# Patient Record
Sex: Female | Born: 1965 | Race: Black or African American | Hispanic: No | Marital: Single | Smoking: Never smoker
Health system: Southern US, Community
[De-identification: ages and names within clinical notes are randomized; demographics above are authoritative.]

## PROBLEM LIST (undated history)

## (undated) DIAGNOSIS — R51 Headache: Secondary | ICD-10-CM

## (undated) DIAGNOSIS — K219 Gastro-esophageal reflux disease without esophagitis: Secondary | ICD-10-CM

## (undated) DIAGNOSIS — R42 Dizziness and giddiness: Secondary | ICD-10-CM

## (undated) DIAGNOSIS — R011 Cardiac murmur, unspecified: Secondary | ICD-10-CM

## (undated) DIAGNOSIS — E236 Other disorders of pituitary gland: Secondary | ICD-10-CM

## (undated) DIAGNOSIS — E7800 Pure hypercholesterolemia, unspecified: Secondary | ICD-10-CM

## (undated) DIAGNOSIS — I10 Essential (primary) hypertension: Secondary | ICD-10-CM

## (undated) HISTORY — DX: Dizziness and giddiness: R42

## (undated) HISTORY — PX: TONSILLECTOMY: SUR1361

## (undated) HISTORY — DX: Gastro-esophageal reflux disease without esophagitis: K21.9

## (undated) HISTORY — DX: Pure hypercholesterolemia, unspecified: E78.00

## (undated) HISTORY — DX: Other disorders of pituitary gland: E23.6

---

## 1999-08-13 ENCOUNTER — Other Ambulatory Visit: Admission: RE | Admit: 1999-08-13 | Discharge: 1999-08-13 | Payer: Self-pay | Admitting: Obstetrics & Gynecology

## 1999-08-31 ENCOUNTER — Ambulatory Visit (HOSPITAL_COMMUNITY): Admission: RE | Admit: 1999-08-31 | Discharge: 1999-08-31 | Payer: Self-pay | Admitting: Obstetrics & Gynecology

## 2000-07-18 ENCOUNTER — Ambulatory Visit (HOSPITAL_COMMUNITY): Admission: RE | Admit: 2000-07-18 | Discharge: 2000-07-18 | Payer: Self-pay | Admitting: *Deleted

## 2000-07-18 ENCOUNTER — Encounter (INDEPENDENT_AMBULATORY_CARE_PROVIDER_SITE_OTHER): Payer: Self-pay

## 2000-08-28 ENCOUNTER — Other Ambulatory Visit: Admission: RE | Admit: 2000-08-28 | Discharge: 2000-08-28 | Payer: Self-pay | Admitting: *Deleted

## 2001-09-04 ENCOUNTER — Other Ambulatory Visit: Admission: RE | Admit: 2001-09-04 | Discharge: 2001-09-04 | Payer: Self-pay | Admitting: *Deleted

## 2002-09-07 ENCOUNTER — Other Ambulatory Visit: Admission: RE | Admit: 2002-09-07 | Discharge: 2002-09-07 | Payer: Self-pay | Admitting: Obstetrics & Gynecology

## 2003-04-06 ENCOUNTER — Encounter: Admission: RE | Admit: 2003-04-06 | Discharge: 2003-04-06 | Payer: Self-pay | Admitting: Obstetrics and Gynecology

## 2003-04-06 ENCOUNTER — Encounter: Payer: Self-pay | Admitting: Obstetrics and Gynecology

## 2003-06-07 ENCOUNTER — Encounter: Payer: Self-pay | Admitting: Family Medicine

## 2003-06-07 ENCOUNTER — Encounter: Admission: RE | Admit: 2003-06-07 | Discharge: 2003-06-07 | Payer: Self-pay | Admitting: Family Medicine

## 2003-09-19 ENCOUNTER — Other Ambulatory Visit: Admission: RE | Admit: 2003-09-19 | Discharge: 2003-09-19 | Payer: Self-pay | Admitting: Obstetrics & Gynecology

## 2004-06-08 ENCOUNTER — Encounter: Admission: RE | Admit: 2004-06-08 | Discharge: 2004-06-08 | Payer: Self-pay | Admitting: Obstetrics & Gynecology

## 2004-09-20 ENCOUNTER — Ambulatory Visit: Payer: Self-pay | Admitting: Cardiology

## 2004-09-27 ENCOUNTER — Ambulatory Visit: Payer: Self-pay

## 2005-09-26 ENCOUNTER — Encounter: Payer: Self-pay | Admitting: Obstetrics & Gynecology

## 2006-09-29 ENCOUNTER — Encounter: Admission: RE | Admit: 2006-09-29 | Discharge: 2006-09-29 | Payer: Self-pay | Admitting: Obstetrics & Gynecology

## 2006-10-03 ENCOUNTER — Encounter: Admission: RE | Admit: 2006-10-03 | Discharge: 2006-10-03 | Payer: Self-pay | Admitting: Obstetrics & Gynecology

## 2006-10-07 ENCOUNTER — Encounter: Admission: RE | Admit: 2006-10-07 | Discharge: 2006-10-07 | Payer: Self-pay | Admitting: Obstetrics & Gynecology

## 2006-10-07 ENCOUNTER — Encounter (INDEPENDENT_AMBULATORY_CARE_PROVIDER_SITE_OTHER): Payer: Self-pay | Admitting: Specialist

## 2006-10-07 HISTORY — PX: BREAST BIOPSY: SHX20

## 2007-10-01 ENCOUNTER — Encounter: Admission: RE | Admit: 2007-10-01 | Discharge: 2007-10-01 | Payer: Self-pay | Admitting: Obstetrics & Gynecology

## 2008-10-03 ENCOUNTER — Encounter: Admission: RE | Admit: 2008-10-03 | Discharge: 2008-10-03 | Payer: Self-pay | Admitting: Obstetrics & Gynecology

## 2009-03-05 ENCOUNTER — Emergency Department (HOSPITAL_COMMUNITY): Admission: EM | Admit: 2009-03-05 | Discharge: 2009-03-05 | Payer: Self-pay | Admitting: Emergency Medicine

## 2009-04-11 ENCOUNTER — Ambulatory Visit: Payer: Self-pay | Admitting: Licensed Clinical Social Worker

## 2009-04-17 ENCOUNTER — Ambulatory Visit: Payer: Self-pay | Admitting: Licensed Clinical Social Worker

## 2009-05-02 ENCOUNTER — Ambulatory Visit: Payer: Self-pay | Admitting: Licensed Clinical Social Worker

## 2009-05-16 ENCOUNTER — Ambulatory Visit: Payer: Self-pay | Admitting: Licensed Clinical Social Worker

## 2009-10-04 ENCOUNTER — Encounter: Admission: RE | Admit: 2009-10-04 | Discharge: 2009-10-04 | Payer: Self-pay | Admitting: Obstetrics & Gynecology

## 2010-01-25 ENCOUNTER — Other Ambulatory Visit: Admission: RE | Admit: 2010-01-25 | Discharge: 2010-01-25 | Payer: Self-pay | Admitting: Obstetrics and Gynecology

## 2010-09-30 ENCOUNTER — Encounter: Payer: Self-pay | Admitting: Obstetrics & Gynecology

## 2010-10-05 ENCOUNTER — Other Ambulatory Visit: Payer: Self-pay | Admitting: Obstetrics and Gynecology

## 2010-10-05 DIAGNOSIS — Z1239 Encounter for other screening for malignant neoplasm of breast: Secondary | ICD-10-CM

## 2010-10-10 ENCOUNTER — Encounter: Payer: Self-pay | Admitting: Obstetrics and Gynecology

## 2010-10-11 ENCOUNTER — Ambulatory Visit
Admission: RE | Admit: 2010-10-11 | Discharge: 2010-10-11 | Disposition: A | Discharge status: Home / Self Care | Payer: 59 | Source: Ambulatory Visit | Attending: Obstetrics and Gynecology | Admitting: Obstetrics and Gynecology

## 2010-10-11 DIAGNOSIS — Z1239 Encounter for other screening for malignant neoplasm of breast: Secondary | ICD-10-CM

## 2010-11-12 ENCOUNTER — Ambulatory Visit: Payer: 59 | Admitting: Licensed Clinical Social Worker

## 2010-11-15 ENCOUNTER — Ambulatory Visit (INDEPENDENT_AMBULATORY_CARE_PROVIDER_SITE_OTHER): Payer: 59 | Admitting: Licensed Clinical Social Worker

## 2010-11-15 DIAGNOSIS — F39 Unspecified mood [affective] disorder: Secondary | ICD-10-CM

## 2010-12-17 LAB — CBC
HCT: 40.2 % (ref 36.0–46.0)
Hemoglobin: 13.5 g/dL (ref 12.0–15.0)
MCHC: 33.5 g/dL (ref 30.0–36.0)
MCV: 93.3 fL (ref 78.0–100.0)
RDW: 12.3 % (ref 11.5–15.5)

## 2010-12-17 LAB — DIFFERENTIAL
Basophils Absolute: 0 10*3/uL (ref 0.0–0.1)
Basophils Relative: 1 % (ref 0–1)
Eosinophils Absolute: 0 10*3/uL (ref 0.0–0.7)
Eosinophils Relative: 1 % (ref 0–5)
Lymphs Abs: 2.5 10*3/uL (ref 0.7–4.0)
Neutrophils Relative %: 52 % (ref 43–77)

## 2010-12-17 LAB — URINALYSIS, ROUTINE W REFLEX MICROSCOPIC
Glucose, UA: NEGATIVE mg/dL
Ketones, ur: NEGATIVE mg/dL
pH: 7.5 (ref 5.0–8.0)

## 2010-12-17 LAB — BASIC METABOLIC PANEL
BUN: 8 mg/dL (ref 6–23)
Creatinine, Ser: 0.78 mg/dL (ref 0.4–1.2)
GFR calc Af Amer: >60 mL/min (ref >60)
GFR calc non Af Amer: >60 mL/min (ref >60)

## 2010-12-17 LAB — D-DIMER, QUANTITATIVE: D-Dimer, Quant: 0.45 ug/mL-FEU (ref 0.00–0.48)

## 2010-12-17 LAB — URINE MICROSCOPIC-ADD ON

## 2010-12-17 LAB — POCT CARDIAC MARKERS: Troponin i, poc: <0.05 ng/mL (ref 0.00–0.09)

## 2011-01-25 NOTE — Op Note (Signed)
[facility]  Patient:    Caitlyn Wilson                   MRN: [id number] Proc. Date: 08/31/99 Adm. Date:  [id number] Attending:  Genia Del                           Operative Report  PREOPERATIVE DIAGNOSIS:       Six weeks two days pregnancy, not desired.  POSTOPERATIVE DIAGNOSIS:      Six weeks two days pregnancy, not desired.  OPERATION:                    D&E with aspiration, elective.  SURGEON:                      Genia Del, M.D.  ASSISTANT:  ANESTHESIA:                   Belva Agee, M.D.  ESTIMATED BLOOD LOSS:  DESCRIPTION OF PROCEDURE:     Under MAC, the patient is in lithotomy position. The suprapubic, vulvar, and vaginal areas are prepped with Betadine and the drapes re placed as usual.  The examination reveals a retroverted uterus corresponding to 6 to 7 weeks.  No adnexal mass.  The speculum is introduced and paracervical block is done with lidocaine 1% 10 cc at 4 oclock and 8 oclock.  The Pazzi clamp is put n on the anterior lip of the cervix.  Dilatation is started with Hegar dilators up to #35 easily.  Then a #9 curved aspiration curet is used.  Aspiration of the products of conception is done and will be sent to pathology.  Then sharp curet is used systematically on all surfaces of the uterus.  The uterine sound is heard all over. Then the aspiration curet is used to remove all debris remaining.  Hemostasis is good.  The instruments are removed and examination is done after the intervention revealing a well contracted uterus.  The estimated blood loss was about 50 cc. The rh is positive.  No complications occurred and the patient is brought to the recovery room in good status. DD:  08/31/99 TD:  09/01/99 Job: [id number]

## 2011-01-25 NOTE — Op Note (Signed)
[facility]  Patient:    Caitlyn Wilson, Caitlyn Wilson                  MRN: [id number] Proc. Date: 07/18/00 Adm. Date:  [id number] Attending:  Ardeen Fillers                           Operative Report  INDICATIONS:                  This is a 45 year old woman G3, P1-0-1-1 last menstrual period May 25, 2000 with positive pregnancy test.  She requests pregnancy termination.  PREOPERATIVE DIAGNOSES:       Intrauterine pregnancy 7+ weeks gestational age, request for termination, Rh positive.  POSTOPERATIVE DIAGNOSES:      Intrauterine pregnancy 7+ weeks gestational age, request for termination, Rh positive.  PROCEDURE:                    Dilation and evacuation.  SURGEON:                      Sung Amabile. Roslyn Smiling, M.D.  ANESTHESIA:                   IV sedation and paracervical block.  ESTIMATED BLOOD LOSS:         Less than 50 cc.  TUBES AND DRAINS:             None.  COMPLICATIONS:                None.  FINDINGS:                     A 6-8 weeks sized retroverted uterus preoperatively.  Postoperative uterine size top normal size.  Tissue obtained on curettage.  No adnexal masses palpable.  SPECIMEN:                     ______ and pathology.  PROCEDURE:                    After the establishment of IV sedation the patient was placed in the dorsal lithotomy position.  The perineum and vagina were prepped with Betadine solution.  Examination under anesthesia was performed after the patient was draped.  Graves speculum was inserted in the vagina.  Cervix was reprepped with Betadine solution.  The anterior cervical lip was infiltrated with 1% Xylocaine then grasped with a single tooth tenaculum.  Paracervical block was placed in the usual fashion using 20 cc of 1% Xylocaine.  Uterine sounded to gently negotiate the direction of the endocervical canal.  Pratt dilators were used to dilate the cervix to a #25 [place].  A #8 suction curette was passed  easily into the uterine cavity and suction curettage was performed.  Gentle sharp curettage was performed.  Final passes of suction curette was made.  The uterus was felt to be empty and the cavity smooth at the end of the case.  Instruments were removed.  Hemostasis was accomplished.  Patient was returned to the supine position and transported to the recovery room in satisfactory condition. DD:  07/18/00 TD:  07/18/00 Job: [id number]

## 2011-01-28 ENCOUNTER — Other Ambulatory Visit: Payer: Self-pay | Admitting: Obstetrics and Gynecology

## 2011-01-28 ENCOUNTER — Other Ambulatory Visit (HOSPITAL_COMMUNITY)
Admission: RE | Admit: 2011-01-28 | Discharge: 2011-01-28 | Disposition: A | Discharge status: Home / Self Care | Payer: 59 | Source: Ambulatory Visit | Attending: Obstetrics and Gynecology | Admitting: Obstetrics and Gynecology

## 2011-01-28 DIAGNOSIS — Z01419 Encounter for gynecological examination (general) (routine) without abnormal findings: Secondary | ICD-10-CM | POA: Insufficient documentation

## 2012-01-22 ENCOUNTER — Other Ambulatory Visit: Payer: Self-pay | Admitting: Obstetrics and Gynecology

## 2012-01-22 DIAGNOSIS — N6320 Unspecified lump in the left breast, unspecified quadrant: Secondary | ICD-10-CM

## 2012-01-22 DIAGNOSIS — N644 Mastodynia: Secondary | ICD-10-CM

## 2012-01-28 ENCOUNTER — Ambulatory Visit
Admission: RE | Admit: 2012-01-28 | Discharge: 2012-01-28 | Disposition: A | Discharge status: Home / Self Care | Payer: 59 | Source: Ambulatory Visit | Attending: Obstetrics and Gynecology | Admitting: Obstetrics and Gynecology

## 2012-01-28 DIAGNOSIS — N6320 Unspecified lump in the left breast, unspecified quadrant: Secondary | ICD-10-CM

## 2012-01-28 DIAGNOSIS — N644 Mastodynia: Secondary | ICD-10-CM

## 2012-04-13 ENCOUNTER — Other Ambulatory Visit (HOSPITAL_COMMUNITY)
Admission: RE | Admit: 2012-04-13 | Discharge: 2012-04-13 | Disposition: A | Discharge status: Home / Self Care | Payer: 59 | Source: Ambulatory Visit | Attending: Obstetrics and Gynecology | Admitting: Obstetrics and Gynecology

## 2012-04-13 ENCOUNTER — Other Ambulatory Visit: Payer: Self-pay | Admitting: Obstetrics and Gynecology

## 2012-04-13 DIAGNOSIS — Z01419 Encounter for gynecological examination (general) (routine) without abnormal findings: Secondary | ICD-10-CM | POA: Insufficient documentation

## 2012-04-13 DIAGNOSIS — Z1151 Encounter for screening for human papillomavirus (HPV): Secondary | ICD-10-CM | POA: Insufficient documentation

## 2012-05-29 ENCOUNTER — Ambulatory Visit (INDEPENDENT_AMBULATORY_CARE_PROVIDER_SITE_OTHER): Payer: 59 | Admitting: Licensed Clinical Social Worker

## 2012-05-29 DIAGNOSIS — F39 Unspecified mood [affective] disorder: Secondary | ICD-10-CM

## 2012-06-08 ENCOUNTER — Ambulatory Visit (INDEPENDENT_AMBULATORY_CARE_PROVIDER_SITE_OTHER): Payer: 59 | Admitting: Licensed Clinical Social Worker

## 2012-06-08 DIAGNOSIS — F39 Unspecified mood [affective] disorder: Secondary | ICD-10-CM

## 2012-07-10 ENCOUNTER — Ambulatory Visit
Admission: RE | Admit: 2012-07-10 | Discharge: 2012-07-10 | Disposition: A | Discharge status: Home / Self Care | Payer: 59 | Source: Ambulatory Visit | Attending: Internal Medicine | Admitting: Internal Medicine

## 2012-07-10 ENCOUNTER — Other Ambulatory Visit: Payer: Self-pay | Admitting: Internal Medicine

## 2012-07-10 DIAGNOSIS — R109 Unspecified abdominal pain: Secondary | ICD-10-CM

## 2012-10-09 ENCOUNTER — Ambulatory Visit (INDEPENDENT_AMBULATORY_CARE_PROVIDER_SITE_OTHER): Payer: 59 | Admitting: Licensed Clinical Social Worker

## 2012-10-09 DIAGNOSIS — F39 Unspecified mood [affective] disorder: Secondary | ICD-10-CM

## 2013-01-06 ENCOUNTER — Other Ambulatory Visit: Payer: Self-pay

## 2013-01-06 DIAGNOSIS — Z1231 Encounter for screening mammogram for malignant neoplasm of breast: Secondary | ICD-10-CM

## 2013-01-20 ENCOUNTER — Emergency Department (HOSPITAL_COMMUNITY)
Admission: EM | Admit: 2013-01-20 | Discharge: 2013-01-20 | Disposition: A | Discharge status: Home / Self Care | Payer: 59 | Source: Home / Self Care

## 2013-01-20 ENCOUNTER — Encounter (HOSPITAL_COMMUNITY): Payer: Self-pay | Admitting: Emergency Medicine

## 2013-01-20 DIAGNOSIS — K625 Hemorrhage of anus and rectum: Secondary | ICD-10-CM

## 2013-01-20 DIAGNOSIS — R109 Unspecified abdominal pain: Secondary | ICD-10-CM

## 2013-01-20 DIAGNOSIS — K5900 Constipation, unspecified: Secondary | ICD-10-CM

## 2013-01-20 HISTORY — DX: Essential (primary) hypertension: I10

## 2013-01-20 LAB — POCT URINALYSIS DIP (DEVICE)
Bilirubin Urine: NEGATIVE
Glucose, UA: NEGATIVE mg/dL
Ketones, ur: NEGATIVE mg/dL
Nitrite: NEGATIVE

## 2013-01-20 NOTE — ED Notes (Signed)
Pt states she has had abdominal pain since October. Went to see PCP for full work--up and told nothing was wrong. Changed to vegetarian diet in March and symptoms subsided. This morning felt constipated and abdominal pain and used finger to get out impacted feces. Noticed blood on tissue after wiping. Worried about rectal bleeding. Also had trace hematuria before. Pt is alert and oriented.

## 2013-01-20 NOTE — ED Provider Notes (Signed)
Medical screening examination/treatment/procedure(s) were performed by non-physician practitioner and as supervising physician I was immediately available for consultation/collaboration.  Issac Moure   Hollynn Garno, MD 01/20/13 1147 

## 2013-01-20 NOTE — ED Provider Notes (Signed)
History     CSN: [id number]  Arrival date & time 01/20/13  1010   None     Chief Complaint  Patient presents with  . Abdominal Pain    (Consider location/radiation/quality/duration/timing/severity/associated sxs/prior treatment) HPI Comments: 47 year old female with chronic mild discomfort along the bilateral abdomin and across the upper abdomen described as prickly, warm and burning. It is intermittent since October. She has had a "full workup" by her physician and has seen a gastroenterologist. She states that after changing to increase liquid diet and a higher fiber 4/vegetables diet she started having normal bowel movements often twice a day. In the past 7-10 days her constipation return. She started using MiraLax 17 g a day for one week with modest results. This morning she felt like she was impacted so she self disimpacted digitally. And had a modest bowel movement. While wiping she saw a trace of blood. This is her primary concern for today.   Past Medical History  Diagnosis Date  . Hypertension     History reviewed. No pertinent past surgical history.  No family history on file.  History  Substance Use Topics  . Smoking status: Never Smoker   . Smokeless tobacco: Not on file  . Alcohol Use: Yes     Comment: occasionally    OB History   Grav Para Term Preterm Abortions TAB SAB Ect Mult Living                  Review of Systems  Constitutional: Negative.   Respiratory: Negative.   Cardiovascular: Negative.   Gastrointestinal: Positive for constipation and blood in stool. Negative for nausea, vomiting, abdominal pain and rectal pain.  Genitourinary: Negative.   Skin: Negative.   Neurological: Negative.     Allergies  Review of patient's allergies indicates no known allergies.  Home Medications   Current Outpatient Rx  Name  Route  Sig  Dispense  Refill  . atenolol (TENORMIN) 25 MG tablet   Oral   Take 25 mg by mouth daily.           BP 143/88   Pulse 58  Temp(Src) 98.7 F (37.1 C) (Oral)  Resp 16  SpO2 98%  Physical Exam  Nursing note and vitals reviewed. Constitutional: She is oriented to person, place, and time. She appears well-developed and well-nourished. No distress.  Eyes: Conjunctivae and EOM are normal.  Neck: Neck supple.  Cardiovascular: Normal rate, regular rhythm and normal heart sounds.   Pulmonary/Chest: Effort normal and breath sounds normal. No respiratory distress. She has no wheezes. She has no rales.  Abdominal: Soft. Bowel sounds are normal. She exhibits distension. She exhibits no mass. There is no tenderness. There is no rebound and no guarding.  Abdominal  percussion is still over the ascending and descending colon. There is some tympany in the epigastric and. Umbilical/middle abdomen. Abdomen is soft and nontender. There is minimal distention without tension in the midabdomen. This would be consistent with stool. No specific mass is palpated.   Genitourinary: Guaiac negative stool.  External rectal exam is normal. DRE reveals no tenderness or ankle obstruction. No lesions or tenderness palpated within the rectal vault. There is no stool in the rectal vault. No masses are palpated and there was no pain or tenderness associated with this exam.  Musculoskeletal: She exhibits no edema and no tenderness.  Neurological: She is alert and oriented to person, place, and time.  Skin: Skin is warm and dry.  Psychiatric: She has a  normal mood and affect.    ED Course  Procedures (including critical care time)  Labs Reviewed  POCT URINALYSIS DIP (DEVICE) - Abnormal; Notable for the following:    Hgb urine dipstick MODERATE (*)    Protein, ur 30 (*)    All other components within normal limits   No results found.  Results for orders placed during the hospital encounter of 01/20/13  POCT URINALYSIS DIP (DEVICE)      Result Value Range   Glucose, UA NEGATIVE  NEGATIVE mg/dL   Bilirubin Urine NEGATIVE  NEGATIVE    Ketones, ur NEGATIVE  NEGATIVE mg/dL   Specific Gravity, Urine >=1.030  1.005 - 1.030   Hgb urine dipstick MODERATE (*) NEGATIVE   pH 6.5  5.0 - 8.0   Protein, ur 30 (*) NEGATIVE mg/dL   Urobilinogen, UA 0.2  0.0 - 1.0 mg/dL   Nitrite NEGATIVE  NEGATIVE   Leukocytes, UA NEGATIVE  NEGATIVE      1. Abdominal  pain, other specified site   2. Constipation   3. Rectal bleeding       MDM  If 17 g of MiraLax he is insufficient to have a good bowel movement he may double dose or head 2 tablets of Dulcolax. Drink plenty of fluids and continue with a high fiber, vegetables diet. If you see additional bleeding will need to followup with your doctor or your gastroenterologist. Today there was no occult blood. The abdominal exam is benign. No urgent findings or findings of concern in today's exam. No Recommend keeping the appointment with physician that you have in June or for worsening or new symptoms or problems may return or go to emergency department.       Hayden Rasmussen, NP 01/20/13 1123

## 2013-01-28 ENCOUNTER — Ambulatory Visit
Admission: RE | Admit: 2013-01-28 | Discharge: 2013-01-28 | Disposition: A | Discharge status: Home / Self Care | Payer: 59 | Source: Ambulatory Visit

## 2013-01-28 DIAGNOSIS — Z1231 Encounter for screening mammogram for malignant neoplasm of breast: Secondary | ICD-10-CM

## 2013-01-29 ENCOUNTER — Emergency Department (HOSPITAL_COMMUNITY): Payer: 59

## 2013-01-29 ENCOUNTER — Emergency Department (HOSPITAL_COMMUNITY)
Admission: EM | Admit: 2013-01-29 | Discharge: 2013-01-29 | Disposition: A | Discharge status: Home / Self Care | Payer: 59 | Attending: Emergency Medicine | Admitting: Emergency Medicine

## 2013-01-29 ENCOUNTER — Encounter (HOSPITAL_COMMUNITY): Payer: Self-pay | Admitting: *Deleted

## 2013-01-29 DIAGNOSIS — M549 Dorsalgia, unspecified: Secondary | ICD-10-CM | POA: Insufficient documentation

## 2013-01-29 DIAGNOSIS — R1013 Epigastric pain: Secondary | ICD-10-CM

## 2013-01-29 DIAGNOSIS — Z79899 Other long term (current) drug therapy: Secondary | ICD-10-CM | POA: Insufficient documentation

## 2013-01-29 DIAGNOSIS — R109 Unspecified abdominal pain: Secondary | ICD-10-CM

## 2013-01-29 DIAGNOSIS — I10 Essential (primary) hypertension: Secondary | ICD-10-CM | POA: Insufficient documentation

## 2013-01-29 DIAGNOSIS — Z880 Allergy status to penicillin: Secondary | ICD-10-CM | POA: Insufficient documentation

## 2013-01-29 DIAGNOSIS — K3189 Other diseases of stomach and duodenum: Secondary | ICD-10-CM | POA: Insufficient documentation

## 2013-01-29 DIAGNOSIS — R000 Tachycardia, unspecified: Secondary | ICD-10-CM | POA: Insufficient documentation

## 2013-01-29 LAB — URINALYSIS, ROUTINE W REFLEX MICROSCOPIC
Ketones, ur: >80 mg/dL — AB
Nitrite: NEGATIVE
Protein, ur: NEGATIVE mg/dL
Urobilinogen, UA: 0.2 mg/dL (ref 0.0–1.0)

## 2013-01-29 LAB — COMPREHENSIVE METABOLIC PANEL
Alkaline Phosphatase: 54 U/L (ref 39–117)
BUN: 9 mg/dL (ref 6–23)
Chloride: 103 mEq/L (ref 96–112)
GFR calc Af Amer: >90 mL/min (ref >90)
Glucose, Bld: 96 mg/dL (ref 70–99)
Potassium: 3.2 mEq/L — ABNORMAL LOW (ref 3.5–5.1)
Total Bilirubin: 0.6 mg/dL (ref 0.3–1.2)

## 2013-01-29 LAB — LIPASE, BLOOD: Lipase: 22 U/L (ref 11–59)

## 2013-01-29 LAB — CBC WITH DIFFERENTIAL/PLATELET
Eosinophils Absolute: 0 10*3/uL (ref 0.0–0.7)
HCT: 41.7 % (ref 36.0–46.0)
Hemoglobin: 14.7 g/dL (ref 12.0–15.0)
Lymphs Abs: 2.2 10*3/uL (ref 0.7–4.0)
MCH: 31.5 pg (ref 26.0–34.0)
Monocytes Absolute: 0.6 10*3/uL (ref 0.1–1.0)
Monocytes Relative: 7 % (ref 3–12)
Neutro Abs: 5.1 10*3/uL (ref 1.7–7.7)
Neutrophils Relative %: 65 % (ref 43–77)
RBC: 4.67 MIL/uL (ref 3.87–5.11)

## 2013-01-29 LAB — URINE MICROSCOPIC-ADD ON

## 2013-01-29 MED ORDER — MORPHINE SULFATE 4 MG/ML IJ SOLN
2.0000 mg | INTRAMUSCULAR | Status: DC | PRN
  Filled 2013-01-29: qty 1

## 2013-01-29 MED ORDER — IOHEXOL 300 MG/ML  SOLN
100.0000 mL | Freq: Once | INTRAMUSCULAR | Status: AC | PRN
  Administered 2013-01-29: 100 mL via INTRAVENOUS

## 2013-01-29 MED ORDER — FAMOTIDINE 20 MG PO TABS: 20.0000 mg | ORAL_TABLET | Freq: Two times a day (BID) | ORAL | Status: DC

## 2013-01-29 MED ORDER — POTASSIUM CHLORIDE CRYS ER 20 MEQ PO TBCR
40.0000 meq | EXTENDED_RELEASE_TABLET | Freq: Once | ORAL | Status: AC
  Administered 2013-01-29: 40 meq via ORAL
  Filled 2013-01-29: qty 2

## 2013-01-29 MED ORDER — IOHEXOL 300 MG/ML  SOLN
50.0000 mL | Freq: Once | INTRAMUSCULAR | Status: AC | PRN
  Administered 2013-01-29: 25 mL via ORAL

## 2013-01-29 MED ORDER — PANTOPRAZOLE SODIUM 40 MG IV SOLR
40.0000 mg | INTRAVENOUS | Status: AC
  Administered 2013-01-29: 40 mg via INTRAVENOUS
  Filled 2013-01-29: qty 40

## 2013-01-29 MED ORDER — FAMOTIDINE IN NACL 20-0.9 MG/50ML-% IV SOLN
20.0000 mg | INTRAVENOUS | Status: AC
  Administered 2013-01-29: 20 mg via INTRAVENOUS
  Filled 2013-01-29: qty 50

## 2013-01-29 MED ORDER — SODIUM CHLORIDE 0.9 % IV BOLUS (SEPSIS)
1000.0000 mL | Freq: Once | INTRAVENOUS | Status: AC
  Administered 2013-01-29: 1000 mL via INTRAVENOUS

## 2013-01-29 NOTE — ED Provider Notes (Signed)
History     CSN: [id number]  Arrival date & time 01/29/13  1508   First MD Initiated Contact with Patient 01/29/13 1703      Chief Complaint  Patient presents with  . Abdominal Pain  . Back Pain    (Consider location/radiation/quality/duration/timing/severity/associated sxs/prior treatment) HPI Comments: 47 y/o female with CC of abd pain - is upper abd pain, radiates to the LUQ and RUQ and to the back - is burning to be worse at night, worse when she lays down, better when she sits up. This is the pain she has been suffering with since October of 2013 when it first started. She had a CAT scan in November showing a what appeared to be a benign renal cysts as well as a poorly characterized fluid collection in her lower thorax. She states that the pain went away after using laxatives over a week but has since come back and has been particulate painful this week. She does have a history of using frequent Goody powders but no other use of anti-inflammatories or prednisone and has not had these aspirin-containing medications in over one year. She takes no antacids. She has seen gastroenterology as well as family doctors but has yet to have a diagnosis for her symptoms. She also admits to drinking a small amount of wine, occasionally, no history of pancreatitis, no history of cholecystitis, no history of abdominal surgery.  Patient is a 47 y.o. female presenting with abdominal pain and back pain. The history is provided by the patient and medical records.  Abdominal Pain Associated symptoms include abdominal pain.  Back Pain Associated symptoms: abdominal pain     Past Medical History  Diagnosis Date  . Hypertension     History reviewed. No pertinent past surgical history.  History reviewed. No pertinent family history.  History  Substance Use Topics  . Smoking status: Never Smoker   . Smokeless tobacco: Not on file  . Alcohol Use: Yes     Comment: occasionally    OB History    Grav Para Term Preterm Abortions TAB SAB Ect Mult Living                  Review of Systems  Gastrointestinal: Positive for abdominal pain.  Musculoskeletal: Positive for back pain.  All other systems reviewed and are negative.    Allergies  Penicillins  Home Medications   Current Outpatient Rx  Name  Route  Sig  Dispense  Refill  . atenolol (TENORMIN) 25 MG tablet   Oral   Take 25 mg by mouth daily.           BP 162/98  Pulse 103  Temp(Src) 98.2 F (36.8 C) (Oral)  Resp 20  SpO2 99%  Physical Exam  Nursing note and vitals reviewed. Constitutional: She appears well-developed and well-nourished. No distress.  HENT:  Head: Normocephalic and atraumatic.  Mouth/Throat: Oropharynx is clear and moist. No oropharyngeal exudate.  Eyes: Conjunctivae and EOM are normal. Pupils are equal, round, and reactive to light. Right eye exhibits no discharge. Left eye exhibits no discharge. No scleral icterus.  Neck: Normal range of motion. Neck supple. No JVD present. No thyromegaly present.  Cardiovascular: Normal rate, regular rhythm, normal heart sounds and intact distal pulses.  Exam reveals no gallop and no friction rub.   No murmur heard. Pulmonary/Chest: Effort normal and breath sounds normal. No respiratory distress. She has no wheezes. She has no rales.  Abdominal: Soft. Bowel sounds are normal. She exhibits  no distension and no mass. There is no tenderness.  Minimal epigastric tenderness, minimal lower abdominal tenderness, this is nonspecific, nonfocal and not associated with guarding or peritoneal signs  Musculoskeletal: Normal range of motion. She exhibits no edema and no tenderness.  Lymphadenopathy:    She has no cervical adenopathy.  Neurological: She is alert. Coordination normal.  Skin: Skin is warm and dry. No rash noted. No erythema.  Psychiatric: She has a normal mood and affect. Her behavior is normal.    ED Course  Procedures (including critical care  time)  Labs Reviewed  COMPREHENSIVE METABOLIC PANEL - Abnormal; Notable for the following:    Potassium 3.2 (*)    Total Protein 8.7 (*)    All other components within normal limits  URINALYSIS, ROUTINE W REFLEX MICROSCOPIC - Abnormal; Notable for the following:    APPearance CLOUDY (*)    Hgb urine dipstick LARGE (*)    Bilirubin Urine SMALL (*)    Ketones, ur >80 (*)    All other components within normal limits  CBC WITH DIFFERENTIAL  LIPASE, BLOOD  URINE MICROSCOPIC-ADD ON  POCT I-STAT TROPONIN I   Mm Digital Screening  01/28/2013   *RADIOLOGY REPORT*  Clinical Data: Screening.  DIGITAL BILATERAL SCREENING MAMMOGRAM WITH CAD  Comparison:  Previous exams.  FINDINGS:  ACR Breast Density Category 3: The breast tissue is heterogeneously dense.  No suspicious masses, non-surgical architectural distortion, or suspicious calcifications are identified.  Images were processed with CAD.  IMPRESSION: No mammographic evidence of malignancy.  A result letter of this screening mammogram will be mailed directly to the patient.  RECOMMENDATION: Screening mammogram in one year. (Code:SM-B-01Y)  BI-RADS CATEGORY 2:  Benign finding(s).   Original Report Authenticated By: Sherian Rein, M.D.     No diagnosis found.    MDM  At this time the patient appears well however she does have a mild borderline tachycardia, her urinalysis shows ketonuria and a high specific gravity but no signs of infection. Lab show no leukocytosis, no anemia and normal electrolytes other than mild hypokalemia. Normal troponin, normal lipase. At this time I suspect that the patient has peptic ulcer disease or dyspepsia, she is requesting and in sitting up on a CT scan which she states was supposed to be ordered by her doctor today however she was unable to see them. I personally placed the IV, CT scan is pending, patient declines pain medications at this time.  Angiocath insertion Performed by: Vida Roller  Consent: Verbal  consent obtained. Risks and benefits: risks, benefits and alternatives were discussed Time out: Immediately prior to procedure a "time out" was called to verify the correct patient, procedure, equipment, support staff and site/side marked as required.  Preparation: Patient was prepped and draped in the usual sterile fashion.  Vein Location: R AC  Not Ultrasound Guided  Gauge: 20  Normal blood return and flush without difficulty Patient tolerance: Patient tolerated the procedure well with no immediate complications.    Discussed findings with the patient, CT scan shows no acute findings, she has been given Pepcid, fluids and has improved, has a stable soft abdomen and understands her discharge instructions.  Meds given in ED:  Medications  morphine 4 MG/ML injection 2 mg (not administered)  famotidine (PEPCID) IVPB 20 mg (0 mg Intravenous Stopped 01/29/13 1928)  pantoprazole (PROTONIX) injection 40 mg (40 mg Intravenous Given 01/29/13 1749)  potassium chloride SA (K-DUR,KLOR-CON) CR tablet 40 mEq (40 mEq Oral Given 01/29/13 1749)  sodium chloride  0.9 % bolus 1,000 mL (1,000 mLs Intravenous New Bag/Given 01/29/13 1749)  iohexol (OMNIPAQUE) 300 MG/ML solution 50 mL (25 mLs Oral Contrast Given 01/29/13 1841)  iohexol (OMNIPAQUE) 300 MG/ML solution 100 mL (100 mLs Intravenous Contrast Given 01/29/13 2010)    New Prescriptions   FAMOTIDINE (PEPCID) 20 MG TABLET    Take 1 tablet (20 mg total) by mouth 2 (two) times daily.           Vida Roller, MD 01/29/13 2107

## 2013-01-29 NOTE — ED Notes (Signed)
Pt reports having severe abd pain for extended amount of time, has been to dr several times and had blood work and xray done recently, was told that wbc count was elevated and wants it to be rechecked and possible ct scan. Pt was unable to wait for md office to call her back to arrange the follow up due to increase in pain, not eating x 3 days and feeling lightheaded. No acute distress noted at this time.

## 2013-01-29 NOTE — ED Notes (Signed)
MD at bedside. 

## 2013-01-30 ENCOUNTER — Encounter (HOSPITAL_COMMUNITY): Payer: Self-pay | Admitting: Family Medicine

## 2013-01-30 ENCOUNTER — Emergency Department (HOSPITAL_COMMUNITY)
Admission: EM | Admit: 2013-01-30 | Discharge: 2013-01-30 | Disposition: A | Discharge status: Home / Self Care | Payer: 59 | Attending: Emergency Medicine | Admitting: Emergency Medicine

## 2013-01-30 DIAGNOSIS — I10 Essential (primary) hypertension: Secondary | ICD-10-CM | POA: Insufficient documentation

## 2013-01-30 DIAGNOSIS — F411 Generalized anxiety disorder: Secondary | ICD-10-CM | POA: Insufficient documentation

## 2013-01-30 DIAGNOSIS — M549 Dorsalgia, unspecified: Secondary | ICD-10-CM | POA: Insufficient documentation

## 2013-01-30 DIAGNOSIS — E860 Dehydration: Secondary | ICD-10-CM | POA: Insufficient documentation

## 2013-01-30 DIAGNOSIS — R197 Diarrhea, unspecified: Secondary | ICD-10-CM

## 2013-01-30 DIAGNOSIS — Z79899 Other long term (current) drug therapy: Secondary | ICD-10-CM | POA: Insufficient documentation

## 2013-01-30 DIAGNOSIS — Z880 Allergy status to penicillin: Secondary | ICD-10-CM | POA: Insufficient documentation

## 2013-01-30 DIAGNOSIS — R109 Unspecified abdominal pain: Secondary | ICD-10-CM | POA: Insufficient documentation

## 2013-01-30 DIAGNOSIS — R450 Nervousness: Secondary | ICD-10-CM | POA: Insufficient documentation

## 2013-01-30 DIAGNOSIS — K921 Melena: Secondary | ICD-10-CM | POA: Insufficient documentation

## 2013-01-30 LAB — POCT I-STAT, CHEM 8
Glucose, Bld: 82 mg/dL (ref 70–99)
HCT: 44 % (ref 36.0–46.0)
Hemoglobin: 15 g/dL (ref 12.0–15.0)
Potassium: 3.7 mEq/L (ref 3.5–5.1)
Sodium: 145 mEq/L (ref 135–145)
TCO2: 28 mmol/L (ref 0–100)

## 2013-01-30 NOTE — ED Notes (Signed)
Pt providing urine sample.

## 2013-01-30 NOTE — ED Provider Notes (Signed)
History     CSN: [id number] Arrival date & time 01/30/13  1036  First MD Initiated Contact with Patient 01/30/13 1110     Chief Complaint  Patient presents with  . Rectal Bleeding   HPI Comments: Patient is a 47 year old female who presents at the request of her physicians nurse line following thorough evaluation in the emergency department last night for abdominal pain.  She reports after going home she had one episode of "anal leakage" of dark stool.  She had one loose stool that was dark but nonbloody this morning.  She did have a CT scan of the abdomen with oral and IVcontrast yesterday due to her bilateral epigastric pain that occasionally radiates to her back.Marland Kitchen  She's been having ongoing abdominal pain for greater than 6 months and has had multiple CT scans and evaluation by a gastroenterologist in [place].    She has not been given a diagnosis prior to yesterday when Dr. Hyacinth Meeker saw her felt that this is most likely consistent with a peptic ulcer and started her on Pepcid.  She denies any worsening abdominal pain, denies any orthostasis, dizziness, headaches, nausea or vomiting.  She was found to be markedly dehydrated on urinalysis yesterday and received IV fluids. There was no signs of infection. She has also been drinking well since being at home and having no further issues with consuming liquids.  Patient is a 47 y.o. female presenting with hematochezia.  Rectal Bleeding Quality:  Black and tarry Amount:  Scant Duration:  1 day Timing:  Rare Progression:  Unable to specify Chronicity:  New Context: constipation   Context: not hemorrhoids and not spontaneously   Similar prior episodes: no   Worsened by:  Nothing tried Ineffective treatments:  None tried Associated symptoms: abdominal pain (unchanged)   Associated symptoms: no dizziness and no vomiting     Past Medical History  Diagnosis Date  . Hypertension    History reviewed. No pertinent past surgical  history.  History reviewed. No pertinent family history.  History  Substance Use Topics  . Smoking status: Never Smoker   . Smokeless tobacco: Not on file  . Alcohol Use: Yes     Comment: occasionally    OB History   Grav Para Term Preterm Abortions TAB SAB Ect Mult Living                  Review of Systems  Constitutional: Positive for appetite change. Negative for chills, diaphoresis, activity change and fatigue.  Respiratory: Negative for cough, chest tightness and shortness of breath.   Cardiovascular: Negative for chest pain and palpitations.  Gastrointestinal: Positive for abdominal pain (unchanged), diarrhea, blood in stool and hematochezia. Negative for nausea, vomiting, constipation and anal bleeding.  Genitourinary: Negative for dysuria, urgency, frequency, hematuria, flank pain and difficulty urinating.  Musculoskeletal: Positive for back pain.  Allergic/Immunologic: Negative.   Neurological: Negative for dizziness, syncope, weakness, numbness and headaches.  Psychiatric/Behavioral: The patient is nervous/anxious.   All other systems reviewed and are negative.    Allergies  Penicillins  Home Medications   Current Outpatient Rx  Name  Route  Sig  Dispense  Refill  . acetaminophen (TYLENOL) 500 MG tablet   Oral   Take 1,000 mg by mouth every 6 (six) hours as needed for pain.         Marland Kitchen atenolol (TENORMIN) 25 MG tablet   Oral   Take 25 mg by mouth every evening.          Marland Kitchen  bismuth subsalicylate (PEPTO BISMOL) 262 MG/15ML suspension   Oral   Take 30 mLs by mouth every 6 (six) hours as needed for indigestion.         . famotidine (PEPCID) 20 MG tablet   Oral   Take 1 tablet (20 mg total) by mouth 2 (two) times daily.   30 tablet   0   . Vitamin D, Ergocalciferol, (DRISDOL) 50000 UNITS CAPS   Oral   Take 50,000 Units by mouth once a week. tuesday         . omeprazole (PRILOSEC) 20 MG capsule   Oral   Take 20 mg by mouth daily as needed (for  indigestion).         . polyethylene glycol (MIRALAX / GLYCOLAX) packet   Oral   Take 17 g by mouth daily as needed (for constipation).         Marland Kitchen zolpidem (AMBIEN) 10 MG tablet   Oral   Take 2.5 mg by mouth at bedtime as needed for sleep.            BP 153/88  Pulse 51  Temp(Src) 98.4 F (36.9 C)  Resp 18  SpO2 99%  Physical Exam  Nursing note and vitals reviewed. Constitutional: She appears well-developed and well-nourished. No distress.  HENT:  Head: Normocephalic and atraumatic.  Eyes: Conjunctivae are normal. Right eye exhibits no discharge. Left eye exhibits no discharge. No scleral icterus.  Neck: No JVD present. No tracheal deviation present.  Cardiovascular: Normal rate, regular rhythm, normal heart sounds and intact distal pulses.  Exam reveals no gallop and no friction rub.   No murmur heard. Pulmonary/Chest: Effort normal and breath sounds normal. No respiratory distress. She has no wheezes. She has no rales. She exhibits no tenderness.  Abdominal: Soft. She exhibits no distension and no mass. There is no tenderness. There is no rebound and no guarding.  Non tender exam. No flank tenderness, no CVA tenderness, no flank echymosis  Genitourinary:  Deferred as patient was able to produce a stool  Musculoskeletal: Normal range of motion. She exhibits no edema.  Neurological: She is alert. She exhibits normal muscle tone.  Skin: Skin is warm and dry. No rash noted. She is not diaphoretic. No erythema. No pallor.  Psychiatric: She has a normal mood and affect. Her behavior is normal. Judgment and thought content normal.  Anxious appearing    ED Course  Procedures (including critical care time)  Labs Reviewed  POCT I-STAT, CHEM 8 - Abnormal; Notable for the following:    BUN 5 (*)    All other components within normal limits  OCCULT BLOOD, POC DEVICE   Ct Abdomen Pelvis W Contrast  01/29/2013   *RADIOLOGY REPORT*  Clinical Data: Abdominal pain  CT ABDOMEN  AND PELVIS WITH CONTRAST  Technique:  Multidetector CT imaging of the abdomen and pelvis was performed following the standard protocol during bolus administration of intravenous contrast.  Contrast: OMNIPAQUE IOHEXOL 300 MG/ML  SOLN  Comparison: 07/10/2012  Findings: Right subdiaphragmatic fluid collection with possible extension to the pericardium image 9 is again noted, unchanged, most likely an incidental finding such as pericardial or bronchogenic cyst.  Subpulmonic fluid collection/effusion less likely.  Too small to characterize lateral segment left hepatic lobe 4 mm hypodensity image 24, likely a cyst or hamartoma. Allowing for differences in technique, this is stable.  Adrenal glands, kidneys, spleen, and pancreas are normal.  No free air or fluid.  Small fat containing umbilical hernia.  Normal appendix and bowel.  No acute osseous finding.  IMPRESSION: No acute intra-abdominal or pelvic pathology.   Original Report Authenticated By: Christiana Pellant, M.D.     1. Diarrhea       MDM  Patient with signs and symptoms consistent with peptic ulcer disease and/or Pepto-Bismol use.   Patient had thorough examination and workup yesterday, no intrabdominal process, normal lipase, normal WBC.  Symptoms seem to be improved with the exception of the new change in stool pattern. Will obtain Hemoccult to determine if this is blood. If she is Hemoccult-positive will change to PPI.  If negative likely result of Pepto-Bismol.  Patient does need to follow up with primary care to address her ongoing pain. but will benefit from PPI.  We'll obtain orthostatic vital signs as well as repeating Hb check to ensure no significant blood loss.      Andrena Mews, DO 01/30/13 1418

## 2013-01-30 NOTE — ED Notes (Signed)
Per pt sts rectal bleeding since last night. sts dark liquid stool. sts leaked out. sts today loose stool and dark. sts recently started pepcid

## 2013-01-30 NOTE — ED Notes (Signed)
Pt seen here last night; reports dark loose stools. Was told to return by radiology dept when called to check if contrast from previous CT. MD at bedside. PT anxious but in no signs of distress.

## 2013-01-30 NOTE — ED Notes (Signed)
PT ambulated with baseline gait; VSS; A&Ox3; no signs of distress; respirations even and unlabored; skin warm and dry; no questions upon discharge.  

## 2013-01-31 NOTE — ED Provider Notes (Signed)
I saw and evaluated the patient, reviewed the resident's note and I agree with the findings and plan.   .Face to face Exam:  General:  Awake HEENT:  Atraumatic Resp:  Normal effort Abd:  Nondistended Neuro:No focal weakness    Nelia Shi, MD 01/31/13 1105

## 2013-11-29 ENCOUNTER — Other Ambulatory Visit: Payer: Self-pay | Admitting: Gastroenterology

## 2013-12-17 ENCOUNTER — Encounter (HOSPITAL_COMMUNITY): Payer: Self-pay | Admitting: *Deleted

## 2013-12-22 ENCOUNTER — Other Ambulatory Visit: Payer: Self-pay

## 2013-12-22 ENCOUNTER — Encounter (HOSPITAL_COMMUNITY): Payer: Self-pay | Admitting: Pharmacy Technician

## 2013-12-22 DIAGNOSIS — Z1231 Encounter for screening mammogram for malignant neoplasm of breast: Secondary | ICD-10-CM

## 2014-01-11 ENCOUNTER — Ambulatory Visit (HOSPITAL_COMMUNITY): Payer: 59 | Admitting: Anesthesiology

## 2014-01-11 ENCOUNTER — Encounter (HOSPITAL_COMMUNITY): Payer: 59 | Admitting: Anesthesiology

## 2014-01-11 ENCOUNTER — Ambulatory Visit (HOSPITAL_COMMUNITY)
Admission: RE | Admit: 2014-01-11 | Discharge: 2014-01-11 | Disposition: A | Discharge status: Home / Self Care | Payer: 59 | Source: Ambulatory Visit | Attending: Gastroenterology | Admitting: Gastroenterology

## 2014-01-11 ENCOUNTER — Encounter (HOSPITAL_COMMUNITY): Payer: Self-pay

## 2014-01-11 ENCOUNTER — Encounter (HOSPITAL_COMMUNITY)
Admission: RE | Disposition: A | Discharge status: Home / Self Care | Payer: Self-pay | Source: Ambulatory Visit | Attending: Gastroenterology

## 2014-01-11 DIAGNOSIS — R011 Cardiac murmur, unspecified: Secondary | ICD-10-CM | POA: Insufficient documentation

## 2014-01-11 DIAGNOSIS — Z1211 Encounter for screening for malignant neoplasm of colon: Secondary | ICD-10-CM | POA: Insufficient documentation

## 2014-01-11 DIAGNOSIS — I10 Essential (primary) hypertension: Secondary | ICD-10-CM | POA: Insufficient documentation

## 2014-01-11 DIAGNOSIS — Z880 Allergy status to penicillin: Secondary | ICD-10-CM | POA: Insufficient documentation

## 2014-01-11 HISTORY — DX: Headache: R51

## 2014-01-11 HISTORY — DX: Cardiac murmur, unspecified: R01.1

## 2014-01-11 HISTORY — PX: COLONOSCOPY WITH PROPOFOL: SHX5780

## 2014-01-11 SURGERY — COLONOSCOPY WITH PROPOFOL
Anesthesia: Monitor Anesthesia Care

## 2014-01-11 MED ORDER — PROPOFOL INFUSION 10 MG/ML OPTIME
INTRAVENOUS | Status: DC | PRN
  Administered 2014-01-11: 140 ug/kg/min via INTRAVENOUS

## 2014-01-11 MED ORDER — MIDAZOLAM HCL 2 MG/2ML IJ SOLN
INTRAMUSCULAR | Status: AC
  Filled 2014-01-11: qty 2

## 2014-01-11 MED ORDER — FENTANYL CITRATE 0.05 MG/ML IJ SOLN
INTRAMUSCULAR | Status: AC
  Filled 2014-01-11: qty 2

## 2014-01-11 MED ORDER — KETAMINE HCL 10 MG/ML IJ SOLN
INTRAMUSCULAR | Status: DC | PRN
  Administered 2014-01-11: 10 mg via INTRAVENOUS

## 2014-01-11 MED ORDER — LACTATED RINGERS IV SOLN
INTRAVENOUS | Status: DC
  Administered 2014-01-11: 12:00:00 via INTRAVENOUS

## 2014-01-11 MED ORDER — SODIUM CHLORIDE 0.9 % IV SOLN: INTRAVENOUS | Status: DC

## 2014-01-11 MED ORDER — PROPOFOL 10 MG/ML IV BOLUS
INTRAVENOUS | Status: AC
  Filled 2014-01-11: qty 40

## 2014-01-11 MED ORDER — MIDAZOLAM HCL 5 MG/5ML IJ SOLN
INTRAMUSCULAR | Status: DC | PRN
  Administered 2014-01-11 (×2): 1 mg via INTRAVENOUS

## 2014-01-11 MED ORDER — FENTANYL CITRATE 0.05 MG/ML IJ SOLN
INTRAMUSCULAR | Status: DC | PRN
Start: 2014-01-11 — End: 2014-01-11
  Administered 2014-01-11 (×2): 50 ug via INTRAVENOUS

## 2014-01-11 SURGICAL SUPPLY — 22 items

## 2014-01-11 NOTE — Anesthesia Postprocedure Evaluation (Signed)
Anesthesia Post Note  Patient: Caitlyn Wilson  Procedure(s) Performed: Procedure(s) (LRB): COLONOSCOPY WITH PROPOFOL (N/A)  Anesthesia type: MAC  Patient location: PACU  Post pain: Pain level controlled  Post assessment: Post-op Vital signs reviewed  Last Vitals: BP 153/80  Pulse 46  Temp(Src) 36.4 C (Oral)  Resp 15  Ht 5' (1.524 m)  Wt 180 lb (81.647 kg)  BMI 35.15 kg/m2  SpO2 100%  Post vital signs: Reviewed  Level of consciousness: awake  Complications: No apparent anesthesia complications

## 2014-01-11 NOTE — Transfer of Care (Signed)
Immediate Anesthesia Transfer of Care Note  Patient: Caitlyn Wilson  Procedure(s) Performed: Procedure(s): COLONOSCOPY WITH PROPOFOL (N/A)  Patient Location: PACU  Anesthesia Type:MAC  Level of Consciousness: awake, alert  and oriented  Airway & Oxygen Therapy: Patient Spontanous Breathing and Patient connected to face mask oxygen  Post-op Assessment: Report given to PACU RN and Post -op Vital signs reviewed and stable  Post vital signs: Reviewed and stable  Complications: No apparent anesthesia complications

## 2014-01-11 NOTE — Anesthesia Preprocedure Evaluation (Signed)
Anesthesia Evaluation  Patient identified by MRN, date of birth, ID band Patient awake    Reviewed: Allergy & Precautions, H&P , NPO status , Patient's Chart, lab work & pertinent test results, reviewed documented beta blocker date and time   Airway Mallampati: II TM Distance: >3 FB Neck ROM: Full    Dental  (+) Dental Advisory Given   Pulmonary neg pulmonary ROS,  breath sounds clear to auscultation        Cardiovascular hypertension, Pt. on medications and Pt. on home beta blockers + Valvular Problems/Murmurs Rhythm:Regular Rate:Normal     Neuro/Psych  Headaches, negative psych ROS   GI/Hepatic negative GI ROS, Neg liver ROS,   Endo/Other  negative endocrine ROS  Renal/GU negative Renal ROS     Musculoskeletal negative musculoskeletal ROS (+)   Abdominal   Peds  Hematology negative hematology ROS (+)   Anesthesia Other Findings   Reproductive/Obstetrics negative OB ROS                           Anesthesia Physical Anesthesia Plan  ASA: II  Anesthesia Plan: MAC   Post-op Pain Management:    Induction: Intravenous  Airway Management Planned:   Additional Equipment:   Intra-op Plan:   Post-operative Plan:   Informed Consent: I have reviewed the patients History and Physical, chart, labs and discussed the procedure including the risks, benefits and alternatives for the proposed anesthesia with the patient or authorized representative who has indicated his/her understanding and acceptance.   Dental advisory given  Plan Discussed with: CRNA  Anesthesia Plan Comments:         Anesthesia Quick Evaluation

## 2014-01-11 NOTE — Discharge Instructions (Signed)
Colonoscopy °Care After °These instructions give you information on caring for yourself after your procedure. Your doctor may also give you more specific instructions. Call your doctor if you have any problems or questions after your procedure. °HOME CARE °· Take it easy for the next 24 hours. °· Rest. °· Walk or use warm packs on your belly (abdomen) if you have belly cramping or gas. °· Do not drive for 24 hours. °· You may shower. °· Do not sign important papers or use machinery for 24 hours. °· Drink enough fluids to keep your pee (urine) clear or pale yellow. °· Resume your normal diet. Avoid heavy or fried foods. °· Avoid alcohol. °· Continue taking your normal medicines. °· Only take medicine as told by your doctor. Do not take aspirin. °If you had growths (polyps) removed: °· Do not take aspirin. °· Do not drink alcohol for 7 days or as told by your doctor. °· Eat a soft diet for 24 hours. °GET HELP RIGHT AWAY IF: °· You have a fever. °· You pass clumps of tissue (blood clots) or fill the toilet with blood. °· You have belly pain that gets worse and medicine does not help. °· Your belly is puffy (swollen). °· You feel sick to your stomach (nauseous) or throw up (vomit). °MAKE SURE YOU: °· Understand these instructions. °· Will watch your condition. °· Will get help right away if you are not doing well or get worse. °Document Released: 09/28/2010 Document Revised: 11/18/2011 Document Reviewed: 05/03/2013 °[employer]® Patient Information ©2014 [employer]. ° °

## 2014-01-11 NOTE — H&P (Signed)
  Procedure: Baseline screening colonoscopy  History: The patient is a 48 year old female born 1966/06/19. She is scheduled to undergo her first screening colonoscopy with polypectomy to prevent colon cancer.  Medication allergies: Penicillin  Past medical history: Tonsillectomy. Hypertension. Anxiety.  Exam: The patient is alert and lying comfortably on the endoscopy stretcher. Abdomen is soft and nontender to palpation. Lungs are clear to auscultation. Cardiac exam reveals a regular rhythm.  Plan: Proceed with baseline screening colonoscopy

## 2014-01-11 NOTE — Op Note (Signed)
Procedure: Baseline screening colonoscopy  Endoscopist: Glenroy Crossen  Premedication: Propofol administered by anesthesia  Procedure: The patient was placed in the left lateral decubitus position. Anal inspection and digital rectal exam were normal. The Pentax pediatric colonoscope was introduced into the rectum and advanced to the cecum. A normal-appearing ileocecal valve and appendiceal orifice were identified. Colonic preparation for the exam today was good.   Rectum. Normal. Retroflexed view of the distal rectum normal  Sigmoid colon and descending colon. Normal  Splenic flexure. Normal  Transverse colon. Normal  Hepatic flexure. Normal  Ascending colon. Normal  Cecum and ileocecal valve. Normal  Assessment: Normal baseline screening colonoscopy  Recommendation: Schedule repeat screening colonoscopy in 10 years 

## 2014-01-12 ENCOUNTER — Encounter (HOSPITAL_COMMUNITY): Payer: Self-pay | Admitting: Gastroenterology

## 2014-02-01 ENCOUNTER — Ambulatory Visit
Admission: RE | Admit: 2014-02-01 | Discharge: 2014-02-01 | Disposition: A | Discharge status: Home / Self Care | Payer: 59 | Source: Ambulatory Visit

## 2014-02-01 DIAGNOSIS — Z1231 Encounter for screening mammogram for malignant neoplasm of breast: Secondary | ICD-10-CM

## 2014-06-16 DIAGNOSIS — Z9889 Other specified postprocedural states: Secondary | ICD-10-CM | POA: Diagnosis not present

## 2014-06-16 DIAGNOSIS — N390 Urinary tract infection, site not specified: Secondary | ICD-10-CM | POA: Insufficient documentation

## 2014-06-16 DIAGNOSIS — Z880 Allergy status to penicillin: Secondary | ICD-10-CM | POA: Diagnosis not present

## 2014-06-16 DIAGNOSIS — R011 Cardiac murmur, unspecified: Secondary | ICD-10-CM | POA: Diagnosis not present

## 2014-06-16 DIAGNOSIS — Z79899 Other long term (current) drug therapy: Secondary | ICD-10-CM | POA: Insufficient documentation

## 2014-06-16 DIAGNOSIS — G43909 Migraine, unspecified, not intractable, without status migrainosus: Secondary | ICD-10-CM | POA: Diagnosis not present

## 2014-06-16 DIAGNOSIS — I10 Essential (primary) hypertension: Secondary | ICD-10-CM | POA: Diagnosis not present

## 2014-06-16 DIAGNOSIS — Z3202 Encounter for pregnancy test, result negative: Secondary | ICD-10-CM | POA: Insufficient documentation

## 2014-06-16 DIAGNOSIS — N898 Other specified noninflammatory disorders of vagina: Secondary | ICD-10-CM | POA: Diagnosis present

## 2014-06-17 ENCOUNTER — Emergency Department (HOSPITAL_COMMUNITY)
Admission: EM | Admit: 2014-06-17 | Discharge: 2014-06-17 | Disposition: A | Discharge status: Home / Self Care | Payer: 59 | Attending: Emergency Medicine | Admitting: Emergency Medicine

## 2014-06-17 ENCOUNTER — Encounter (HOSPITAL_COMMUNITY): Payer: Self-pay | Admitting: Emergency Medicine

## 2014-06-17 DIAGNOSIS — N939 Abnormal uterine and vaginal bleeding, unspecified: Secondary | ICD-10-CM

## 2014-06-17 DIAGNOSIS — N390 Urinary tract infection, site not specified: Secondary | ICD-10-CM

## 2014-06-17 LAB — WET PREP, GENITAL
Clue Cells Wet Prep HPF POC: NONE SEEN
Trich, Wet Prep: NONE SEEN
WBC, Wet Prep HPF POC: NONE SEEN
Yeast Wet Prep HPF POC: NONE SEEN

## 2014-06-17 LAB — POC URINE PREG, ED: Preg Test, Ur: NEGATIVE

## 2014-06-17 LAB — URINALYSIS, ROUTINE W REFLEX MICROSCOPIC
BILIRUBIN URINE: NEGATIVE
Glucose, UA: NEGATIVE mg/dL
KETONES UR: NEGATIVE mg/dL
NITRITE: NEGATIVE
Protein, ur: NEGATIVE mg/dL
Specific Gravity, Urine: 1.031 — ABNORMAL HIGH (ref 1.005–1.030)
UROBILINOGEN UA: 0.2 mg/dL (ref 0.0–1.0)
pH: 5.5 (ref 5.0–8.0)

## 2014-06-17 LAB — URINE MICROSCOPIC-ADD ON

## 2014-06-17 MED ORDER — CEPHALEXIN 500 MG PO CAPS
500.0000 mg | ORAL_CAPSULE | Freq: Once | ORAL | Status: AC
  Administered 2014-06-17: 500 mg via ORAL
  Filled 2014-06-17: qty 1

## 2014-06-17 MED ORDER — CEPHALEXIN 500 MG PO CAPS: 500.0000 mg | ORAL_CAPSULE | Freq: Two times a day (BID) | ORAL | Status: DC

## 2014-06-17 NOTE — Discharge Instructions (Signed)
Your vaginal bleeding is coming from the right wall of your vagina where there is an area of irritation. This is likely due to menopause and loss of estrogen which causes decreased lubrication. It does not appear that this bleeding is coming from your uterus. There is no sign of vaginal infection. You may follow up with your PCP or OB/GYN for this. This should continue to improve over time.   Urinary Tract Infection Urinary tract infections (UTIs) can develop anywhere along your urinary tract. Your urinary tract is your body's drainage system for removing wastes and extra water. Your urinary tract includes two kidneys, two ureters, a bladder, and a urethra. Your kidneys are a pair of bean-shaped organs. Each kidney is about the size of your fist. They are located below your ribs, one on each side of your spine. CAUSES Infections are caused by microbes, which are microscopic organisms, including fungi, viruses, and bacteria. These organisms are so small that they can only be seen through a microscope. Bacteria are the microbes that most commonly cause UTIs. SYMPTOMS  Symptoms of UTIs may vary by age and gender of the patient and by the location of the infection. Symptoms in young women typically include a frequent and intense urge to urinate and a painful, burning feeling in the bladder or urethra during urination. Older women and men are more likely to be tired, shaky, and weak and have muscle aches and abdominal pain. A fever may mean the infection is in your kidneys. Other symptoms of a kidney infection include pain in your back or sides below the ribs, nausea, and vomiting. DIAGNOSIS To diagnose a UTI, your caregiver will ask you about your symptoms. Your caregiver also will ask to provide a urine sample. The urine sample will be tested for bacteria and white blood cells. White blood cells are made by your body to help fight infection. TREATMENT  Typically, UTIs can be treated with medication. Because  most UTIs are caused by a bacterial infection, they usually can be treated with the use of antibiotics. The choice of antibiotic and length of treatment depend on your symptoms and the type of bacteria causing your infection. HOME CARE INSTRUCTIONS  If you were prescribed antibiotics, take them exactly as your caregiver instructs you. Finish the medication even if you feel better after you have only taken some of the medication.  Drink enough water and fluids to keep your urine clear or pale yellow.  Avoid caffeine, tea, and carbonated beverages. They tend to irritate your bladder.  Empty your bladder often. Avoid holding urine for long periods of time.  Empty your bladder before and after sexual intercourse.  After a bowel movement, women should cleanse from front to back. Use each tissue only once. SEEK MEDICAL CARE IF:   You have back pain.  You develop a fever.  Your symptoms do not begin to resolve within 3 days. SEEK IMMEDIATE MEDICAL CARE IF:   You have severe back pain or lower abdominal pain.  You develop chills.  You have nausea or vomiting.  You have continued burning or discomfort with urination. MAKE SURE YOU:   Understand these instructions.  Will watch your condition.  Will get help right away if you are not doing well or get worse. Document Released: 06/05/2005 Document Revised: 02/25/2012 Document Reviewed: 10/04/2011 [facility]Care Patient Information 2015 [employer], [place]. This information is not intended to replace advice given to you by your health care provider. Make sure you discuss any questions you have  with your health care provider. ° °

## 2014-06-17 NOTE — ED Notes (Signed)
Pt presents with c/o vaginal bleeding. Pt reports she has not had a period in at least 8 years, no hx of ovarian cysts. Pt reports the bleeding is light at this time, not passing any clots.

## 2014-06-17 NOTE — ED Provider Notes (Signed)
TIME SEEN: 2:15 AM  CHIEF COMPLAINT: Vaginal bleeding  HPI: Patient is a 48 year old female with history of hypertension, migraines who presents to the emergency department with vaginal bleeding that started tonight. She reports that she went to the bathroom to urinate and noticed that there was a small amount of blood in her underwear and on the toilet paper when she wiped. She states she has had some crampy abdominal pain since being in the emergency department. Denies fevers, chills, nausea, vomiting, diarrhea, bloody stools or melena. No history of vaginal discharge. She states her last menstrual period was 8 years ago. She is not sexually active. Denies having any foreign bodies in her vagina.  ROS: See HPI Constitutional: no fever  Eyes: no drainage  ENT: no runny nose   Cardiovascular:  no chest pain  Resp: no SOB  GI: no vomiting GU: no dysuria Integumentary: no rash  Allergy: no hives  Musculoskeletal: no leg swelling  Neurological: no slurred speech ROS otherwise negative  PAST MEDICAL HISTORY/PAST SURGICAL HISTORY:  Past Medical History  Diagnosis Date  . Hypertension   . Heart murmur   . Headache(784.0)     migraines many yrs ago    MEDICATIONS:  Prior to Admission medications   Medication Sig Start Date End Date Taking? Authorizing Provider  atenolol (TENORMIN) 25 MG tablet Take 25 mg by mouth every evening.    Yes Historical Provider, MD  ibuprofen (ADVIL,MOTRIN) 200 MG tablet Take 400 mg by mouth every 6 (six) hours as needed for moderate pain.   Yes Historical Provider, MD  zolpidem (AMBIEN) 10 MG tablet Take 2.5 mg by mouth at bedtime as needed for sleep.    Yes Historical Provider, MD    ALLERGIES:  Allergies  Allergen Reactions  . Penicillins Rash    Allergic to all cillins    SOCIAL HISTORY:  History  Substance Use Topics  . Smoking status: Never Smoker   . Smokeless tobacco: Not on file  . Alcohol Use: Yes     Comment: occasionally -2 drinks per  week    FAMILY HISTORY: No family history on file.  EXAM: BP 130/76  Pulse 62  Temp(Src) 98.3 F (36.8 C) (Oral)  Resp 20  SpO2 100% CONSTITUTIONAL: Alert and oriented and responds appropriately to questions. Well-appearing; well-nourished HEAD: Normocephalic EYES: Conjunctivae clear, PERRL ENT: normal nose; no rhinorrhea; moist mucous membranes; pharynx without lesions noted NECK: Supple, no meningismus, no LAD  CARD: RRR; S1 and S2 appreciated; no murmurs, no clicks, no rubs, no gallops RESP: Normal chest excursion without splinting or tachypnea; breath sounds clear and equal bilaterally; no wheezes, no rhonchi, no rales,  ABD/GI: Normal bowel sounds; non-distended; soft, non-tender, no rebound, no guarding GU:  Normal external genitalia, patient has a area of irritation and bleeding noted in the right lateral wall of the vagina, minimal vaginal discharge, no cervical motion tenderness, no blood coming from the cervical os, no adnexal tenderness or fullness BACK:  The back appears normal and is non-tender to palpation, there is no CVA tenderness EXT: Normal ROM in all joints; non-tender to palpation; no edema; normal capillary refill; no cyanosis    SKIN: Normal color for age and race; warm NEURO: Moves all extremities equally PSYCH: The patient's mood and manner are appropriate. Grooming and personal hygiene are appropriate.  MEDICAL DECISION MAKING: Patient here with vaginal bleeding. She has an area of irritation and bleeding to the right vaginal wall. Likely from decreased lubrication secondary to menopause and  decreased estrogen. She denies anything being in her vagina recently. She has no signs of infection or laceration that needs repair. Urinalysis also shows possible UTI. Will discharge home with Keflex. No bleeding seen from the uterus. I feel she is safe to be discharged home. Patient verbalizes understanding and is comfortable with plan. Discussed return  precautions.    Layla MawKristen N Adreena Willits, DO 06/17/14 [id number]

## 2014-06-17 NOTE — ED Notes (Signed)
Patient reports bright red blood when wiping. Denies active bleeding. Patient reports cramping in lower abdomen earlier. Denies radiating pain, n/v/d, fever, chills, lightheadedness, dizziness.

## 2014-06-18 LAB — URINE CULTURE

## 2014-06-18 LAB — GC/CHLAMYDIA PROBE AMP
CT PROBE, AMP APTIMA: NEGATIVE
GC PROBE AMP APTIMA: NEGATIVE

## 2014-09-14 ENCOUNTER — Other Ambulatory Visit: Payer: Self-pay | Admitting: Obstetrics and Gynecology

## 2014-09-14 DIAGNOSIS — N644 Mastodynia: Secondary | ICD-10-CM

## 2014-09-16 ENCOUNTER — Ambulatory Visit
Admission: RE | Admit: 2014-09-16 | Discharge: 2014-09-16 | Disposition: A | Discharge status: Home / Self Care | Payer: 59 | Source: Ambulatory Visit | Attending: Obstetrics and Gynecology | Admitting: Obstetrics and Gynecology

## 2014-09-16 DIAGNOSIS — N644 Mastodynia: Secondary | ICD-10-CM

## 2014-12-13 ENCOUNTER — Ambulatory Visit
Admission: RE | Admit: 2014-12-13 | Discharge: 2014-12-13 | Disposition: A | Discharge status: Home / Self Care | Payer: 59 | Source: Ambulatory Visit | Attending: Internal Medicine | Admitting: Internal Medicine

## 2014-12-13 ENCOUNTER — Other Ambulatory Visit: Payer: Self-pay | Admitting: Internal Medicine

## 2014-12-13 DIAGNOSIS — R042 Hemoptysis: Secondary | ICD-10-CM

## 2015-01-31 ENCOUNTER — Other Ambulatory Visit: Payer: Self-pay

## 2015-01-31 DIAGNOSIS — Z1231 Encounter for screening mammogram for malignant neoplasm of breast: Secondary | ICD-10-CM

## 2015-02-08 ENCOUNTER — Encounter (INDEPENDENT_AMBULATORY_CARE_PROVIDER_SITE_OTHER): Payer: Self-pay

## 2015-02-08 ENCOUNTER — Ambulatory Visit
Admission: RE | Admit: 2015-02-08 | Discharge: 2015-02-08 | Disposition: A | Discharge status: Home / Self Care | Payer: 59 | Source: Ambulatory Visit

## 2015-02-08 DIAGNOSIS — Z1231 Encounter for screening mammogram for malignant neoplasm of breast: Secondary | ICD-10-CM

## 2015-02-10 ENCOUNTER — Emergency Department (HOSPITAL_COMMUNITY)
Admission: EM | Admit: 2015-02-10 | Discharge: 2015-02-10 | Disposition: A | Discharge status: Home / Self Care | Payer: 59 | Attending: Emergency Medicine | Admitting: Emergency Medicine

## 2015-02-10 ENCOUNTER — Encounter (HOSPITAL_COMMUNITY): Payer: Self-pay | Admitting: *Deleted

## 2015-02-10 DIAGNOSIS — Z3202 Encounter for pregnancy test, result negative: Secondary | ICD-10-CM | POA: Insufficient documentation

## 2015-02-10 DIAGNOSIS — R011 Cardiac murmur, unspecified: Secondary | ICD-10-CM | POA: Insufficient documentation

## 2015-02-10 DIAGNOSIS — Z79899 Other long term (current) drug therapy: Secondary | ICD-10-CM | POA: Diagnosis not present

## 2015-02-10 DIAGNOSIS — I10 Essential (primary) hypertension: Secondary | ICD-10-CM | POA: Diagnosis not present

## 2015-02-10 DIAGNOSIS — Z880 Allergy status to penicillin: Secondary | ICD-10-CM | POA: Diagnosis not present

## 2015-02-10 DIAGNOSIS — N898 Other specified noninflammatory disorders of vagina: Secondary | ICD-10-CM | POA: Diagnosis not present

## 2015-02-10 DIAGNOSIS — N939 Abnormal uterine and vaginal bleeding, unspecified: Secondary | ICD-10-CM

## 2015-02-10 LAB — COMPREHENSIVE METABOLIC PANEL
ALBUMIN: 4.2 g/dL (ref 3.5–5.0)
ALT: 16 U/L (ref 14–54)
AST: 28 U/L (ref 15–41)
Alkaline Phosphatase: 49 U/L (ref 38–126)
Anion gap: 9 (ref 5–15)
BILIRUBIN TOTAL: 0.3 mg/dL (ref 0.3–1.2)
BUN: 16 mg/dL (ref 6–20)
CO2: 26 mmol/L (ref 22–32)
Calcium: 9.3 mg/dL (ref 8.9–10.3)
Chloride: 104 mmol/L (ref 101–111)
Creatinine, Ser: 0.76 mg/dL (ref 0.44–1.00)
GFR calc Af Amer: >60 mL/min (ref >60)
Glucose, Bld: 121 mg/dL — ABNORMAL HIGH (ref 65–99)
POTASSIUM: 3.7 mmol/L (ref 3.5–5.1)
Sodium: 139 mmol/L (ref 135–145)
Total Protein: 8.3 g/dL — ABNORMAL HIGH (ref 6.5–8.1)

## 2015-02-10 LAB — URINALYSIS, ROUTINE W REFLEX MICROSCOPIC
BILIRUBIN URINE: NEGATIVE
GLUCOSE, UA: NEGATIVE mg/dL
Ketones, ur: NEGATIVE mg/dL
Nitrite: NEGATIVE
PH: 6.5 (ref 5.0–8.0)
Protein, ur: NEGATIVE mg/dL
Specific Gravity, Urine: 1.005 (ref 1.005–1.030)
UROBILINOGEN UA: 0.2 mg/dL (ref 0.0–1.0)

## 2015-02-10 LAB — WET PREP, GENITAL
Clue Cells Wet Prep HPF POC: NONE SEEN
Trich, Wet Prep: NONE SEEN
Yeast Wet Prep HPF POC: NONE SEEN

## 2015-02-10 LAB — CBC WITH DIFFERENTIAL/PLATELET
Basophils Absolute: 0 10*3/uL (ref 0.0–0.1)
Basophils Relative: 0 % (ref 0–1)
EOS PCT: 0 % (ref 0–5)
Eosinophils Absolute: 0 10*3/uL (ref 0.0–0.7)
HEMATOCRIT: 41.1 % (ref 36.0–46.0)
Hemoglobin: 13.5 g/dL (ref 12.0–15.0)
LYMPHS ABS: 1.9 10*3/uL (ref 0.7–4.0)
LYMPHS PCT: 20 % (ref 12–46)
MCH: 30.8 pg (ref 26.0–34.0)
MCHC: 32.8 g/dL (ref 30.0–36.0)
MCV: 93.6 fL (ref 78.0–100.0)
Monocytes Absolute: 0.4 10*3/uL (ref 0.1–1.0)
Monocytes Relative: 5 % (ref 3–12)
NEUTROS ABS: 7 10*3/uL (ref 1.7–7.7)
Neutrophils Relative %: 75 % (ref 43–77)
Platelets: 282 10*3/uL (ref 150–400)
RBC: 4.39 MIL/uL (ref 3.87–5.11)
RDW: 12.7 % (ref 11.5–15.5)
WBC: 9.3 10*3/uL (ref 4.0–10.5)

## 2015-02-10 LAB — URINE MICROSCOPIC-ADD ON

## 2015-02-10 LAB — POC URINE PREG, ED: PREG TEST UR: NEGATIVE

## 2015-02-10 NOTE — ED Notes (Signed)
Pelvic cart set up in room if needed

## 2015-02-10 NOTE — Discharge Instructions (Signed)
Please call your doctor for a followup appointment within 24-48 hours. When you talk to your doctor please let them know that you were seen in the emergency department and have them acquire all of your records so that they can discuss the findings with you and formulate a treatment plan to fully care for your new and ongoing problems. Please call and set-up an appointment with your primary care provider Please call and set-up an appointment with your OBGYN - please call to be re-assessed early next week Please rest and stay hydrated Please avoid any physical or strenuous activity  Please continue to monitor symptoms closely and if symptoms are to worsen or change (fever greater than 101, chills, sweating, nausea, vomiting, chest pain, shortness of breathe, difficulty breathing, weakness, numbness, tingling, worsening or changes to pain pattern, increased bleeding, passing of blood clots, back pain, pelvic pain, dizziness, fainting, visual changes) please report back to the Emergency Department immediately.    Abnormal Uterine Bleeding Abnormal uterine bleeding can affect women at various stages in life, including teenagers, women in their reproductive years, pregnant women, and women who have reached menopause. Several kinds of uterine bleeding are considered abnormal, including:  Bleeding or spotting between periods.   Bleeding after sexual intercourse.   Bleeding that is heavier or more than normal.   Periods that last longer than usual.  Bleeding after menopause.  Many cases of abnormal uterine bleeding are minor and simple to treat, while others are more serious. Any type of abnormal bleeding should be evaluated by your health care provider. Treatment will depend on the cause of the bleeding. HOME CARE INSTRUCTIONS Monitor your condition for any changes. The following actions may help to alleviate any discomfort you are experiencing:  Avoid the use of tampons and douches as directed  by your health care provider.  Change your pads frequently. You should get regular pelvic exams and Pap tests. Keep all follow-up appointments for diagnostic tests as directed by your health care provider.  SEEK MEDICAL CARE IF:   Your bleeding lasts more than 1 week.   You feel dizzy at times.  SEEK IMMEDIATE MEDICAL CARE IF:   You pass out.   You are changing pads every 15 to 30 minutes.   You have abdominal pain.  You have a fever.   You become sweaty or weak.   You are passing large blood clots from the vagina.   You start to feel nauseous and vomit. MAKE SURE YOU:   Understand these instructions.  Will watch your condition.  Will get help right away if you are not doing well or get worse. Document Released: 08/26/2005 Document Revised: 08/31/2013 Document Reviewed: 03/25/2013 [facility] Patient Information 2015 [employer], [place]. This information is not intended to replace advice given to you by your health care provider. Make sure you discuss any questions you have with your health care provider.

## 2015-02-10 NOTE — ED Notes (Addendum)
Patient comes from home w/vaginal itching/burning x3 days and bloody vaginal discharge since morning.  Patient used an OTC yeast treatment last night and this morning noticed bright red blood when she wiped.  Patient is post-menopausal.  Patient denies overt abdominal or vaginal pain.

## 2015-02-10 NOTE — ED Provider Notes (Signed)
CSN: [id number]     Arrival date & time 02/10/15  0818 History   First MD Initiated Contact with Patient 02/10/15 [phone]     Chief Complaint  Patient presents with  . Vaginal Itching     (Consider location/radiation/quality/duration/timing/severity/associated sxs/prior Treatment) The history is provided by the patient. No language interpreter was used.  Caitlyn Wilson is a 49 y/o F with PMHx of HTN, heart murmur, headaches presenting to the ED with vaginal itching, burning that started 5 days ago. Patient reported that the vaginal itching and burning got progressively worse throughout the week resulting in patient using an OTC monostat-like yeast infection treatment yesterday. Patient reported that when she woke up this morning she noticed a milkly discharge that had bright red blood in the area, streaks. Patient reported that when she wiped again she noticed increased bright red blood on tissue - denied only occurs when she wipes, does not have blood clots or blood on underwear. Patient reported that her last Pap smear was last year and was negative. Patient reported that mother has history of uterine cancer - reported that she found this out after her menopause she noticed bleeding. Denied hysterectomy or oophorectomy. Denied blood clots, abdominal pain, nausea, vomiting, diarrhea, melena, hematochezia, back pain, neck pain, neck stiffness, fever, chills, weakness, fatigue, sores, lesions, dysuria, hematuria. Denied being sexually active. PCP Dr. polite OB/GYN Dr. Lowell Guitar  Past Medical History  Diagnosis Date  . Hypertension   . Heart murmur   . Headache(784.0)     migraines many yrs ago   Past Surgical History  Procedure Laterality Date  . Tonsillectomy    . Colonoscopy with propofol N/A 01/11/2014    Procedure: COLONOSCOPY WITH PROPOFOL;  Surgeon: Charolett Bumpers, MD;  Location: [facility];  Service: Endoscopy;  Laterality: N/A;   No family history on file. History  Substance Use  Topics  . Smoking status: Never Smoker   . Smokeless tobacco: Never Used  . Alcohol Use: Yes     Comment: occasionally -2 drinks per week   OB History    No data available     Review of Systems  Constitutional: Negative for fever and chills.  Respiratory: Negative for chest tightness and shortness of breath.   Cardiovascular: Negative for chest pain.  Gastrointestinal: Negative for nausea, vomiting, abdominal pain, diarrhea, constipation, blood in stool and anal bleeding.  Genitourinary: Positive for vaginal bleeding and vaginal discharge. Negative for dysuria, hematuria, vaginal pain and pelvic pain.  Musculoskeletal: Negative for back pain, neck pain and neck stiffness.  Neurological: Negative for dizziness, weakness and numbness.      Allergies  Penicillins  Home Medications   Prior to Admission medications   Medication Sig Start Date End Date Taking? Authorizing Provider  ALPRAZolam Prudy Feeler) 0.5 MG tablet Take 0.5 mg by mouth daily as needed for anxiety.  01/16/15  Yes Historical Provider, MD  atenolol (TENORMIN) 25 MG tablet Take 25 mg by mouth every evening.    Yes Historical Provider, MD  hydrochlorothiazide (HYDRODIURIL) 25 MG tablet Take 6.25 mg by mouth daily.  12/02/14  Yes Historical Provider, MD  ibuprofen (ADVIL,MOTRIN) 200 MG tablet Take 400 mg by mouth every 6 (six) hours as needed for moderate pain.   Yes Historical Provider, MD  Multiple Vitamins-Minerals (MULTIVITAMIN ADULT PO) Take 1 tablet by mouth daily.   Yes Historical Provider, MD  zolpidem (AMBIEN) 10 MG tablet Take 2.5 mg by mouth at bedtime as needed for sleep.    Yes  Historical Provider, MD  cephALEXin (KEFLEX) 500 MG capsule Take 1 capsule (500 mg total) by mouth 2 (two) times daily. Patient not taking: Reported on 02/10/2015 06/17/14   Kristen N Ward, DO   BP 142/85 mmHg  Pulse 55  Temp(Src) 97.5 F (36.4 C) (Oral)  Resp 14  SpO2 100% Physical Exam  Constitutional: She is oriented to person, place,  and time. She appears well-developed and well-nourished. No distress.  HENT:  Head: Normocephalic and atraumatic.  Eyes: Conjunctivae and EOM are normal. Right eye exhibits no discharge. Left eye exhibits no discharge.  Neck: Normal range of motion. Neck supple. No tracheal deviation present.  Cardiovascular: Normal rate, regular rhythm and normal heart sounds.  Exam reveals no friction rub.   No murmur heard. Pulses:      Radial pulses are 2+ on the right side, and 2+ on the left side.       Dorsalis pedis pulses are 2+ on the right side, and 2+ on the left side.  Pulmonary/Chest: Effort normal and breath sounds normal. No respiratory distress. She has no wheezes. She has no rales.  Abdominal: Soft. Bowel sounds are normal. She exhibits no distension. There is no tenderness. There is no rebound, no guarding and no CVA tenderness.  Genitourinary:  Pelvic exam: Negative swelling, erythema, inflammation, lesions, sores, deformities identified to the external genitalia. Negative areas of induration or fluctuance. Negative bright red blood in the vaginal vault. Area of ulceration identified to the left vaginal wall with negative active bleeding. Cervix identified with negative friability, mild scant amount of bright red blood noted. Negative CMT or bilateral adnexal tenderness. Exam chaperoned with tech, Marylu Lund  Musculoskeletal: Normal range of motion.  Lymphadenopathy:    She has no cervical adenopathy.  Neurological: She is alert and oriented to person, place, and time. No cranial nerve deficit. She exhibits normal muscle tone. Coordination normal.  Skin: Skin is warm and dry. No rash noted. She is not diaphoretic. No erythema.  Psychiatric: She has a normal mood and affect. Her behavior is normal. Thought content normal.  Nursing note and vitals reviewed.   ED Course  Procedures (including critical care time)  Results for orders placed or performed during the hospital encounter of 02/10/15   Wet prep, genital  Result Value Ref Range   Yeast Wet Prep HPF POC NONE SEEN NONE SEEN   Trich, Wet Prep NONE SEEN NONE SEEN   Clue Cells Wet Prep HPF POC NONE SEEN NONE SEEN   WBC, Wet Prep HPF POC FEW (A) NONE SEEN  Urinalysis, Routine w reflex microscopic (not at [facility])  Result Value Ref Range   Color, Urine YELLOW YELLOW   APPearance CLEAR CLEAR   Specific Gravity, Urine 1.005 1.005 - 1.030   pH 6.5 5.0 - 8.0   Glucose, UA NEGATIVE NEGATIVE mg/dL   Hgb urine dipstick LARGE (A) NEGATIVE   Bilirubin Urine NEGATIVE NEGATIVE   Ketones, ur NEGATIVE NEGATIVE mg/dL   Protein, ur NEGATIVE NEGATIVE mg/dL   Urobilinogen, UA 0.2 0.0 - 1.0 mg/dL   Nitrite NEGATIVE NEGATIVE   Leukocytes, UA SMALL (A) NEGATIVE  CBC with Differential/Platelet  Result Value Ref Range   WBC 9.3 4.0 - 10.5 K/uL   RBC 4.39 3.87 - 5.11 MIL/uL   Hemoglobin 13.5 12.0 - 15.0 g/dL   HCT 16.1 09.6 - 04.5 %   MCV 93.6 78.0 - 100.0 fL   MCH 30.8 26.0 - 34.0 pg   MCHC 32.8 30.0 - 36.0 g/dL  RDW 12.7 11.5 - 15.5 %   Platelets 282 150 - 400 K/uL   Neutrophils Relative % 75 43 - 77 %   Neutro Abs 7.0 1.7 - 7.7 K/uL   Lymphocytes Relative 20 12 - 46 %   Lymphs Abs 1.9 0.7 - 4.0 K/uL   Monocytes Relative 5 3 - 12 %   Monocytes Absolute 0.4 0.1 - 1.0 K/uL   Eosinophils Relative 0 0 - 5 %   Eosinophils Absolute 0.0 0.0 - 0.7 K/uL   Basophils Relative 0 0 - 1 %   Basophils Absolute 0.0 0.0 - 0.1 K/uL  Comprehensive metabolic panel  Result Value Ref Range   Sodium 139 135 - 145 mmol/L   Potassium 3.7 3.5 - 5.1 mmol/L   Chloride 104 101 - 111 mmol/L   CO2 26 22 - 32 mmol/L   Glucose, Bld 121 (H) 65 - 99 mg/dL   BUN 16 6 - 20 mg/dL   Creatinine, Ser 1.61 0.44 - 1.00 mg/dL   Calcium 9.3 8.9 - 09.6 mg/dL   Total Protein 8.3 (H) 6.5 - 8.1 g/dL   Albumin 4.2 3.5 - 5.0 g/dL   AST 28 15 - 41 U/L   ALT 16 14 - 54 U/L   Alkaline Phosphatase 49 38 - 126 U/L   Total Bilirubin 0.3 0.3 - 1.2 mg/dL   GFR calc non Af Amer  >60 >60 mL/min   GFR calc Af Amer >60 >60 mL/min   Anion gap 9 5 - 15  Urine microscopic-add on  Result Value Ref Range   Squamous Epithelial / LPF MANY (A) RARE   WBC, UA 7-10 <3 WBC/hpf   RBC / HPF 7-10 <3 RBC/hpf   Bacteria, UA FEW (A) RARE  POC urine preg, ED (not at [facility])  Result Value Ref Range   Preg Test, Ur NEGATIVE NEGATIVE    Labs Review Labs Reviewed  WET PREP, GENITAL - Abnormal; Notable for the following:    WBC, Wet Prep HPF POC FEW (*)    All other components within normal limits  URINALYSIS, ROUTINE W REFLEX MICROSCOPIC (NOT AT [facility]) - Abnormal; Notable for the following:    Hgb urine dipstick LARGE (*)    Leukocytes, UA SMALL (*)    All other components within normal limits  COMPREHENSIVE METABOLIC PANEL - Abnormal; Notable for the following:    Glucose, Bld 121 (*)    Total Protein 8.3 (*)    All other components within normal limits  URINE MICROSCOPIC-ADD ON - Abnormal; Notable for the following:    Squamous Epithelial / LPF MANY (*)    Bacteria, UA FEW (*)    All other components within normal limits  CBC WITH DIFFERENTIAL/PLATELET  POC URINE PREG, ED  GC/CHLAMYDIA PROBE AMP () NOT AT [facility]    Imaging Review No results found.   EKG Interpretation None      MDM   Final diagnoses:  Vaginal bleeding  Vaginal irritation    Medications - No data to display  Filed Vitals:   02/10/15 0845 02/10/15 1110  BP: 140/85 142/85  Pulse: 60 55  Temp: 97.5 F (36.4 C)   TempSrc: Oral   Resp: 17 14  SpO2: 100% 100%   CBC negative elevated leukocytosis. Hemoglobin 13.5, hematocrit 41. CMP unremarkable. Glucose 121 negative elevated anion gap. Urinalysis noted large hemoglobin with a red blood cell count 7-10, negative nitrates and small leukocytes with a white blood cell count of 7-10. Many squamous  cells identified a few bacteria-urine culture pending. Urine pregnancy negative. Wet prep negative for yeast, trichomoniasis, clue cells, few  white blood cells. Patient presented to the ED with vaginal discharge with bright red blood streaks that occurred this morning. Patient is postmenopausal since she was 49 years of age. Pelvic exam identified ulceration to the left side of the vaginal wall with negative active bleeding. Scant amount of blood noted from the cervix. Negative findings of UTI or pyelonephritis. Abdominal exam unremarkable-negative signs for acute abdomen, no emergent imaging needed at this time. Pelvic exam without tenderness. Patient reported that mother has history of uterine cancer after menopause. Discussed with patient regarding high suspicion for possibilities of uterine versus cervical cancer since assessed from family-patient understood and states that she will follow-up with OB/GYN. Small leukocytes with white blood cell count of 7-10 noted on urine with many squamous cells-this appears to be a contaminated specimen, patient is not symptomatic-will send for a urine culture. Patient stable, afebrile. Patient not septic appearing. Negative signs of respiratory distress. Discharged patient. Discussed with patient to follow up with OBGYN and PCP. Discussed with patient that bleeding after menopause can mean cervical/uterine cancer - highly recommended patient to be seen by OBGYN early next week. Discussed with patient to closely monitor for symptoms and if symptoms are to worsen or change to report back to the ED immediately. Discussed with patient to closely monitor symptoms and if symptoms are to worsen or change to report back to the ED - strict return instructions given.  Patient agreed to plan of care, understood, all questions answered.     Raymon MuttonMarissa Tamila Gaulin, PA-C 02/10/15 1229  Benjiman CoreNathan Pickering, MD 02/10/15 [phone]

## 2015-02-11 LAB — URINE CULTURE
CULTURE: NO GROWTH
Colony Count: NO GROWTH
Special Requests: NORMAL

## 2015-02-13 LAB — GC/CHLAMYDIA PROBE AMP (~~LOC~~) NOT AT ARMC
CHLAMYDIA, DNA PROBE: NEGATIVE
Neisseria Gonorrhea: NEGATIVE

## 2016-01-06 ENCOUNTER — Emergency Department (HOSPITAL_COMMUNITY)
Admission: EM | Admit: 2016-01-06 | Discharge: 2016-01-06 | Disposition: A | Discharge status: Home / Self Care | Payer: 59 | Attending: Emergency Medicine | Admitting: Emergency Medicine

## 2016-01-06 ENCOUNTER — Emergency Department (HOSPITAL_COMMUNITY): Payer: 59

## 2016-01-06 ENCOUNTER — Encounter (HOSPITAL_COMMUNITY): Payer: Self-pay | Admitting: Emergency Medicine

## 2016-01-06 DIAGNOSIS — Z791 Long term (current) use of non-steroidal anti-inflammatories (NSAID): Secondary | ICD-10-CM | POA: Diagnosis not present

## 2016-01-06 DIAGNOSIS — R079 Chest pain, unspecified: Secondary | ICD-10-CM | POA: Diagnosis present

## 2016-01-06 DIAGNOSIS — Z79899 Other long term (current) drug therapy: Secondary | ICD-10-CM | POA: Diagnosis not present

## 2016-01-06 DIAGNOSIS — I10 Essential (primary) hypertension: Secondary | ICD-10-CM | POA: Diagnosis not present

## 2016-01-06 LAB — CBC
HEMATOCRIT: 38.3 % (ref 36.0–46.0)
Hemoglobin: 12.8 g/dL (ref 12.0–15.0)
MCH: 30.3 pg (ref 26.0–34.0)
MCHC: 33.4 g/dL (ref 30.0–36.0)
MCV: 90.8 fL (ref 78.0–100.0)
Platelets: 250 10*3/uL (ref 150–400)
RBC: 4.22 MIL/uL (ref 3.87–5.11)
RDW: 12.8 % (ref 11.5–15.5)
WBC: 8 10*3/uL (ref 4.0–10.5)

## 2016-01-06 LAB — BASIC METABOLIC PANEL
Anion gap: 10 (ref 5–15)
BUN: 16 mg/dL (ref 6–20)
CHLORIDE: 108 mmol/L (ref 101–111)
CO2: 25 mmol/L (ref 22–32)
CREATININE: 0.82 mg/dL (ref 0.44–1.00)
Calcium: 9.5 mg/dL (ref 8.9–10.3)
GFR calc Af Amer: >60 mL/min (ref >60)
GFR calc non Af Amer: >60 mL/min (ref >60)
Glucose, Bld: 87 mg/dL (ref 65–99)
POTASSIUM: 3.3 mmol/L — AB (ref 3.5–5.1)
SODIUM: 143 mmol/L (ref 135–145)

## 2016-01-06 LAB — I-STAT TROPONIN, ED: TROPONIN I, POC: 0 ng/mL (ref 0.00–0.08)

## 2016-01-06 NOTE — ED Notes (Signed)
Patient transported to X-ray 

## 2016-01-06 NOTE — ED Notes (Signed)
PA at bedside.

## 2016-01-06 NOTE — ED Notes (Signed)
Pt reports understanding of discharge information. No questions at time of discharge 

## 2016-01-06 NOTE — ED Notes (Signed)
Patient states today she was cleaning and afterwards she noticed a squeezing pan that is intermittent to her left chest and arm tingling. Denies SOB. Patient denies having had this pain previously. Patient states she was referred to the ER by the "nurse line".

## 2016-01-06 NOTE — ED Provider Notes (Signed)
CSN: [id number]     Arrival date & time 01/06/16  1943 History   First MD Initiated Contact with Patient 01/06/16 2024     Chief Complaint  Patient presents with  . Chest Pain    HPI Comments: 50 year old female presents with acute onset of chest pain for the past couple hours. Past medical history significant for hypertension. She has never felt this pain before. She states she was cleaning up around the house when she had a sharp pain in the left side of her chest. It was intermittent but became constant. It does not radiate. Currently a 4 out of 10. Reports associated left arm numbness and tingling. Denies fever, chills, syncope, shortness of breath, cough, abdominal pain, nausea, vomiting, diaphoresis.  Patient is a 50 y.o. female presenting with chest pain.  Chest Pain Associated symptoms: numbness   Associated symptoms: no abdominal pain, no cough, no fever, no nausea, no shortness of breath and not vomiting     Past Medical History  Diagnosis Date  . Hypertension   . Heart murmur   . Headache(784.0)     migraines many yrs ago   Past Surgical History  Procedure Laterality Date  . Tonsillectomy    . Colonoscopy with propofol N/A 01/11/2014    Procedure: COLONOSCOPY WITH PROPOFOL;  Surgeon: Charolett BumpersMartin K Johnson, MD;  Location: [facility];  Service: Endoscopy;  Laterality: N/A;   History reviewed. No pertinent family history. Social History  Substance Use Topics  . Smoking status: Never Smoker   . Smokeless tobacco: Never Used  . Alcohol Use: Yes     Comment: occasionally    OB History    No data available     Review of Systems  Constitutional: Negative for fever and chills.  Respiratory: Negative for cough and shortness of breath.   Cardiovascular: Positive for chest pain.  Gastrointestinal: Negative for nausea, vomiting and abdominal pain.  Neurological: Positive for numbness.  All other systems reviewed and are negative.   Allergies  Penicillins  Home Medications    Prior to Admission medications   Medication Sig Start Date End Date Taking? Authorizing Provider  ALPRAZolam Prudy Feeler(XANAX) 0.5 MG tablet Take 0.5 mg by mouth daily as needed for anxiety.  01/16/15  Yes Historical Provider, MD  atenolol (TENORMIN) 25 MG tablet Take 12.5 mg by mouth every evening.    Yes Historical Provider, MD  hydrochlorothiazide (HYDRODIURIL) 25 MG tablet Take 12.5 mg by mouth daily.  12/02/14  Yes Historical Provider, MD  ibuprofen (ADVIL,MOTRIN) 200 MG tablet Take 400 mg by mouth every 6 (six) hours as needed for moderate pain.   Yes Historical Provider, MD  Multiple Vitamins-Minerals (MULTIVITAMIN ADULT PO) Take 1 tablet by mouth daily.   Yes Historical Provider, MD  zolpidem (AMBIEN) 10 MG tablet Take 2.5 mg by mouth at bedtime as needed for sleep.    Yes Historical Provider, MD   BP 139/83 mmHg  Pulse 56  Temp(Src) 98.2 F (36.8 C) (Oral)  Resp 17  Ht 5' (1.524 m)  Wt 83.008 kg  BMI 35.74 kg/m2  SpO2 100%   Physical Exam  Constitutional: She is oriented to person, place, and time. She appears well-developed and well-nourished. No distress.  HENT:  Head: Normocephalic and atraumatic.  Eyes: Conjunctivae are normal. Pupils are equal, round, and reactive to light. Right eye exhibits no discharge. Left eye exhibits no discharge. No scleral icterus.  Neck: Normal range of motion.  Cardiovascular: Regular rhythm.  Bradycardia present.  Exam reveals  no gallop and no friction rub.   No murmur heard. Pulmonary/Chest: Effort normal and breath sounds normal. No respiratory distress. She has no wheezes. She has no rales. She exhibits tenderness.  Reproducible tenderness over the L chest wall above the L breast  Abdominal: Soft. Bowel sounds are normal. She exhibits no distension and no mass. There is no tenderness. There is no rebound and no guarding.  Neurological: She is alert and oriented to person, place, and time.  Skin: Skin is warm and dry.  Psychiatric: She has a normal  mood and affect.    ED Course  Procedures (including critical care time) Labs Review Labs Reviewed  BASIC METABOLIC PANEL - Abnormal; Notable for the following:    Potassium 3.3 (*)    All other components within normal limits  CBC  I-STAT TROPOININ, ED    Imaging Review Dg Chest 2 View  01/06/2016  CLINICAL DATA:  Left-sided chest pain EXAM: CHEST  2 VIEW COMPARISON:  12/13/2014 FINDINGS: The heart size and mediastinal contours are within normal limits. Both lungs are clear. The visualized skeletal structures are unremarkable. IMPRESSION: No active cardiopulmonary disease. Electronically Signed   By: Signa Kell M.D.   On: 01/06/2016 20:28   I have personally reviewed and evaluated these images and lab results as part of my medical decision-making.   EKG Interpretation   Date/Time:  Saturday January 06 2016 19:50:54 EDT Ventricular Rate:  58 PR Interval:  145 QRS Duration: 87 QT Interval:  406 QTC Calculation: 399 R Axis:   35 Text Interpretation:  Sinus rhythm ST elev, probable normal early repol  pattern since last tracing no significant change Confirmed by Effie Shy  MD,  ELLIOTT ([id number]) on 01/06/2016 9:54:52 PM      MDM   Final diagnoses:  Chest pain, unspecified chest pain type   50 year old female who presents with chest pain. Unlikely her symptoms are cardiac in nature. Her EKG is reassuring. Troponin is normal. There is reproducible chest tenderness on exam. Labs are unremarkable. Chest x-ray is clear.   She is low risk for PE. She is not hypoxic, no dyspnea. She has never had a DVT, is not on exogenous hormones, no hemoptysis, no recent injury, surgery, or travel. Repeat EKG is normal sinus rhythm and unchanged. Heart score is 2. Patient is in NAD with stable VS. Return precautions given. Patient informed of clinical course, understand medical decision-making process, and agree with plan.   Bethel Born, PA-C 01/06/16 2217  Mancel Bale, MD 01/06/16  [phone]

## 2016-01-08 ENCOUNTER — Other Ambulatory Visit: Payer: Self-pay

## 2016-01-08 DIAGNOSIS — Z1231 Encounter for screening mammogram for malignant neoplasm of breast: Secondary | ICD-10-CM

## 2016-02-09 ENCOUNTER — Ambulatory Visit: Payer: Self-pay

## 2016-02-12 ENCOUNTER — Other Ambulatory Visit: Payer: Self-pay | Admitting: Obstetrics and Gynecology

## 2016-02-12 ENCOUNTER — Ambulatory Visit
Admission: RE | Admit: 2016-02-12 | Discharge: 2016-02-12 | Disposition: A | Discharge status: Home / Self Care | Payer: 59 | Source: Ambulatory Visit

## 2016-02-12 DIAGNOSIS — Z1231 Encounter for screening mammogram for malignant neoplasm of breast: Secondary | ICD-10-CM

## 2016-02-14 ENCOUNTER — Other Ambulatory Visit: Payer: Self-pay | Admitting: Obstetrics and Gynecology

## 2016-02-14 DIAGNOSIS — R928 Other abnormal and inconclusive findings on diagnostic imaging of breast: Secondary | ICD-10-CM

## 2016-02-15 ENCOUNTER — Ambulatory Visit
Admission: RE | Admit: 2016-02-15 | Discharge: 2016-02-15 | Disposition: A | Discharge status: Home / Self Care | Payer: 59 | Source: Ambulatory Visit | Attending: Obstetrics and Gynecology | Admitting: Obstetrics and Gynecology

## 2016-02-15 DIAGNOSIS — R928 Other abnormal and inconclusive findings on diagnostic imaging of breast: Secondary | ICD-10-CM

## 2016-03-05 ENCOUNTER — Ambulatory Visit: Payer: Self-pay

## 2017-01-03 ENCOUNTER — Other Ambulatory Visit: Payer: Self-pay | Admitting: Obstetrics and Gynecology

## 2017-01-03 DIAGNOSIS — Z1231 Encounter for screening mammogram for malignant neoplasm of breast: Secondary | ICD-10-CM

## 2017-02-13 ENCOUNTER — Ambulatory Visit: Payer: Self-pay

## 2017-02-25 ENCOUNTER — Ambulatory Visit
Admission: RE | Admit: 2017-02-25 | Discharge: 2017-02-25 | Disposition: A | Discharge status: Home / Self Care | Payer: 59 | Source: Ambulatory Visit | Attending: Obstetrics and Gynecology | Admitting: Obstetrics and Gynecology

## 2017-02-25 DIAGNOSIS — Z1231 Encounter for screening mammogram for malignant neoplasm of breast: Secondary | ICD-10-CM

## 2018-01-26 ENCOUNTER — Other Ambulatory Visit: Payer: Self-pay | Admitting: Obstetrics and Gynecology

## 2018-01-26 DIAGNOSIS — Z1231 Encounter for screening mammogram for malignant neoplasm of breast: Secondary | ICD-10-CM

## 2018-02-27 ENCOUNTER — Ambulatory Visit: Payer: Self-pay

## 2018-03-18 ENCOUNTER — Ambulatory Visit
Admission: RE | Admit: 2018-03-18 | Discharge: 2018-03-18 | Disposition: A | Discharge status: Home / Self Care | Payer: 59 | Source: Ambulatory Visit | Attending: Obstetrics and Gynecology | Admitting: Obstetrics and Gynecology

## 2018-03-18 DIAGNOSIS — Z1231 Encounter for screening mammogram for malignant neoplasm of breast: Secondary | ICD-10-CM

## 2018-04-29 ENCOUNTER — Emergency Department (HOSPITAL_COMMUNITY)
Admission: EM | Admit: 2018-04-29 | Discharge: 2018-04-29 | Disposition: A | Discharge status: Home / Self Care | Payer: 59 | Attending: Emergency Medicine | Admitting: Emergency Medicine

## 2018-04-29 ENCOUNTER — Other Ambulatory Visit: Payer: Self-pay

## 2018-04-29 DIAGNOSIS — I10 Essential (primary) hypertension: Secondary | ICD-10-CM | POA: Diagnosis not present

## 2018-04-29 DIAGNOSIS — Z79899 Other long term (current) drug therapy: Secondary | ICD-10-CM | POA: Diagnosis not present

## 2018-04-29 DIAGNOSIS — R42 Dizziness and giddiness: Secondary | ICD-10-CM | POA: Insufficient documentation

## 2018-04-29 MED ORDER — MECLIZINE HCL 25 MG PO TABS
25.0000 mg | ORAL_TABLET | Freq: Once | ORAL | Status: AC
  Administered 2018-04-29: 25 mg via ORAL
  Filled 2018-04-29: qty 1

## 2018-04-29 MED ORDER — LORAZEPAM 2 MG/ML IJ SOLN
1.0000 mg | Freq: Once | INTRAMUSCULAR | Status: AC
  Administered 2018-04-29: 1 mg via INTRAVENOUS
  Filled 2018-04-29: qty 1

## 2018-04-29 MED ORDER — SODIUM CHLORIDE 0.9 % IV BOLUS
500.0000 mL | Freq: Once | INTRAVENOUS | Status: AC
  Administered 2018-04-29: 500 mL via INTRAVENOUS

## 2018-04-29 NOTE — ED Provider Notes (Signed)
[facility]-EMERGENCY DEPT Provider Note   CSN: [id number] Arrival date & time: 04/29/18  0815     History   Chief Complaint Chief Complaint  Patient presents with  . Dizziness    HPI Caitlyn Wilson is a 52 y.o. female.  HPI   52 year old female with dizziness.  Onset this morning as she was getting out of bed.  Feels better at rest.  Symptoms worsening with changes in position.  Felt very unsteady when ambulating and nauseated.  Did vomit once.  Denies any acute pain such as headaches, chest, back or abdominal pain.  No respiratory complaints.  No acute numbness, tingling or focal loss of strength.  No ear pain or fullness.  No tinnitus. Past Medical History:  Diagnosis Date  . Headache(784.0)    migraines many yrs ago  . Heart murmur   . Hypertension     Patient Active Problem List   Diagnosis Date Noted  . Diarrhea 01/30/2013    Past Surgical History:  Procedure Laterality Date  . BREAST BIOPSY Left 10/07/2006   benign  . COLONOSCOPY WITH PROPOFOL N/A 01/11/2014   Procedure: COLONOSCOPY WITH PROPOFOL;  Surgeon: Charolett BumpersMartin K Johnson, MD;  Location: [facility];  Service: Endoscopy;  Laterality: N/A;  . TONSILLECTOMY       OB History   None      Home Medications    Prior to Admission medications   Medication Sig Start Date End Date Taking? Authorizing Provider  ALPRAZolam Prudy Feeler(XANAX) 0.5 MG tablet Take 0.5 mg by mouth daily as needed for anxiety.  01/16/15   [provider]  atenolol (TENORMIN) 25 MG tablet Take 12.5 mg by mouth every evening.     [provider]  hydrochlorothiazide (HYDRODIURIL) 25 MG tablet Take 12.5 mg by mouth daily.  12/02/14   [provider]  ibuprofen (ADVIL,MOTRIN) 200 MG tablet Take 400 mg by mouth every 6 (six) hours as needed for moderate pain.    [provider]  Multiple Vitamins-Minerals (MULTIVITAMIN ADULT PO) Take 1 tablet by mouth daily.    [provider]  Vitamin D,  Ergocalciferol, (DRISDOL) 50000 units CAPS capsule Take 1 capsule by mouth once a week. 04/06/18   [provider]  zolpidem (AMBIEN) 10 MG tablet Take 2.5 mg by mouth at bedtime as needed for sleep.     [provider]    Family History No family history on file.  Social History Social History   Tobacco Use  . Smoking status: Never Smoker  . Smokeless tobacco: Never Used  Substance Use Topics  . Alcohol use: Yes    Comment: occasionally   . Drug use: No     Allergies   Penicillins   Review of Systems Review of Systems  All systems reviewed and negative, other than as noted in HPI.  Physical Exam Updated Vital Signs BP 129/79 (BP Location: Right Arm)   Pulse (!) 53   Temp 97.9 F (36.6 C) (Oral)   Resp 15   Ht 5\' 5"  (1.651 m)   Wt 83.9 kg   SpO2 93%   BMI 30.79 kg/m   Physical Exam  Constitutional: She is oriented to person, place, and time. She appears well-developed and well-nourished. No distress.  HENT:  Head: Normocephalic and atraumatic.  Eyes: Pupils are equal, round, and reactive to light. Conjunctivae and EOM are normal. Right eye exhibits no discharge. Left eye exhibits no discharge.  Neck: Neck supple.  Cardiovascular: Normal rate, regular  rhythm and normal heart sounds. Exam reveals no gallop and no friction rub.  No murmur heard. Pulmonary/Chest: Effort normal and breath sounds normal. No respiratory distress.  Abdominal: Soft. She exhibits no distension. There is no tenderness.  Musculoskeletal: She exhibits no edema or tenderness.  Neurological: She is alert and oriented to person, place, and time. No cranial nerve deficit. She exhibits normal muscle tone. Coordination normal.  Good finger-nose testing bilaterally.  Skin: Skin is warm and dry.  Psychiatric: She has a normal mood and affect. Her behavior is normal. Thought content normal.  Nursing note and vitals reviewed.    ED Treatments / Results  Labs (all labs ordered  are listed, but only abnormal results are displayed) Labs Reviewed - No data to display  EKG EKG Interpretation  Date/Time:  Wednesday April 29 2018 08:30:28 EDT Ventricular Rate:  52 PR Interval:    QRS Duration: 92 QT Interval:  434 QTC Calculation: 404 R Axis:   47 Text Interpretation:  Sinus rhythm ST elev, probable normal early repol pattern No old tracing to compare Confirmed by Raeford RazorKohut, Rianne Degraaf [phone]([id number]) on 04/29/2018 9:08:08 AM   Radiology No results found.  Procedures Procedures (including critical care time)  Medications Ordered in ED Medications  meclizine (ANTIVERT) tablet 25 mg (has no administration in time range)  LORazepam (ATIVAN) injection 1 mg (has no administration in time range)  sodium chloride 0.9 % bolus 500 mL (has no administration in time range)     Initial Impression / Assessment and Plan / ED Course  I have reviewed the triage vital signs and the nursing notes.  Pertinent labs & imaging results that were available during my care of the patient were reviewed by me and considered in my medical decision making (see chart for details).     52yF with vertigo. Clearly describes room spinning sensation. Worse with movement. Nonfocal neuro exam and no neuro complaints.. Mild bradycardia noted, but I doubt it's the cause of her symptoms.  He was treated symptomatically with resolution of symptoms.  I doubt central etiology such as CVA.  Continue symptom medic treatment.  Return precautions were discussed.  Outpatient follow-up as needed otherwise.  Final Clinical Impressions(s) / ED Diagnoses   Final diagnoses:  Vertigo    ED Discharge Orders    None       Raeford RazorKohut, Abbeygail Igoe, MD 04/30/18 [phone]

## 2018-04-29 NOTE — ED Notes (Signed)
Pt reports she feels much better, denies any pain or dizziness.  She is calling for her parents to pick her up.

## 2018-04-29 NOTE — ED Triage Notes (Signed)
Patient woke up this morning with dizziness. Hx vertigo, patient states feels different. Dizziness upon position change. +N/V. Denies CP/SOB. No new meds. 4mg  zofran given by EMS. NSR on ECG.  BP: 136/91 HR: 64 RR: 18 O2: 98% CBG: 142

## 2018-04-29 NOTE — ED Notes (Signed)
Pt care assumed, verbal report obtained.  Pt denies any pain or dizziness.  She is A&Ox 4.  In NAD.  No obvious neuro deficits noted.

## 2018-04-29 NOTE — ED Notes (Signed)
Pt ambulated in hallway to the restroom. Pt stated " I'm feeling better".

## 2018-04-29 NOTE — ED Notes (Signed)
Patient reports improvement in dizziness

## 2018-05-27 DIAGNOSIS — R51 Headache: Secondary | ICD-10-CM | POA: Insufficient documentation

## 2018-05-27 DIAGNOSIS — R002 Palpitations: Secondary | ICD-10-CM | POA: Diagnosis not present

## 2018-05-27 DIAGNOSIS — E876 Hypokalemia: Secondary | ICD-10-CM | POA: Insufficient documentation

## 2018-05-28 ENCOUNTER — Emergency Department (HOSPITAL_COMMUNITY): Payer: 59

## 2018-05-28 ENCOUNTER — Emergency Department (HOSPITAL_COMMUNITY)
Admission: EM | Admit: 2018-05-28 | Discharge: 2018-05-28 | Disposition: A | Discharge status: Home / Self Care | Payer: 59 | Attending: Emergency Medicine | Admitting: Emergency Medicine

## 2018-05-28 ENCOUNTER — Encounter (HOSPITAL_COMMUNITY): Payer: Self-pay

## 2018-05-28 DIAGNOSIS — E876 Hypokalemia: Secondary | ICD-10-CM

## 2018-05-28 DIAGNOSIS — R519 Headache, unspecified: Secondary | ICD-10-CM

## 2018-05-28 DIAGNOSIS — R002 Palpitations: Secondary | ICD-10-CM

## 2018-05-28 DIAGNOSIS — R51 Headache: Secondary | ICD-10-CM

## 2018-05-28 LAB — CBC
HCT: 41 % (ref 36.0–46.0)
Hemoglobin: 13.8 g/dL (ref 12.0–15.0)
MCH: 30.9 pg (ref 26.0–34.0)
MCHC: 33.7 g/dL (ref 30.0–36.0)
MCV: 91.7 fL (ref 78.0–100.0)
Platelets: 306 10*3/uL (ref 150–400)
RBC: 4.47 MIL/uL (ref 3.87–5.11)
RDW: 12.6 % (ref 11.5–15.5)
WBC: 8.5 10*3/uL (ref 4.0–10.5)

## 2018-05-28 LAB — BASIC METABOLIC PANEL
Anion gap: 9 (ref 5–15)
BUN: 10 mg/dL (ref 6–20)
CO2: 30 mmol/L (ref 22–32)
Calcium: 9.8 mg/dL (ref 8.9–10.3)
Chloride: 107 mmol/L (ref 98–111)
Creatinine, Ser: 0.74 mg/dL (ref 0.44–1.00)
GFR calc Af Amer: >60 mL/min (ref >60)
GFR calc non Af Amer: >60 mL/min (ref >60)
Glucose, Bld: 88 mg/dL (ref 70–99)
Potassium: 3 mmol/L — ABNORMAL LOW (ref 3.5–5.1)
Sodium: 146 mmol/L — ABNORMAL HIGH (ref 135–145)

## 2018-05-28 LAB — I-STAT BETA HCG BLOOD, ED (MC, WL, AP ONLY): I-stat hCG, quantitative: <5 m[IU]/mL (ref <5)

## 2018-05-28 LAB — I-STAT TROPONIN, ED: Troponin i, poc: 0 ng/mL (ref 0.00–0.08)

## 2018-05-28 MED ORDER — POTASSIUM CHLORIDE ER 10 MEQ PO TBCR: 10.0000 meq | EXTENDED_RELEASE_TABLET | Freq: Two times a day (BID) | ORAL | 0 refills | Status: DC

## 2018-05-28 NOTE — ED Triage Notes (Signed)
Pt reports that she had a headache all day and when she went to lay down tonight, she started feeling like her heart was racing. No tachy in triage. SOB during these episodes. A&Ox4. Ambulatory. Denies N/V/D. No pain.

## 2018-05-28 NOTE — ED Provider Notes (Signed)
[facility]-EMERGENCY DEPT Provider Note   CSN: [id number] Arrival date & time: 05/27/18  2355     History   Chief Complaint Chief Complaint  Patient presents with  . Headache  . Tachycardia    HPI Caitlyn Wilson is a 52 y.o. female.  Patient is a 52 year old female with past medical history of hypertension.  She presents today for evaluation of headache and palpitations.  She was diagnosed with peripheral vertigo 3 weeks ago, however her dizziness has persisted despite being evaluated by ENT and her primary doctor.  She is also been to physical therapy which has not helped.  Several days ago, she developed a headache.  The pain is mainly at the top of her head.  She denies any visual disturbances, weakness, or numbness.  This evening while lying down, she developed heart racing.  This episode lasted for several minutes, then resolved.  She denied any chest pain, shortness of breath, or diaphoresis during this episode.  She denies any prior heart history.  She does report having had a stress test in the past which was unremarkable.  The history is provided by the patient.  Headache   This is a new problem. Episode onset: 3 days ago. The problem occurs constantly. The problem has been gradually worsening. The headache is associated with nothing. The pain is located in the parietal region. The quality of the pain is described as throbbing. The pain is moderate. The pain does not radiate. Pertinent negatives include no fever, no orthopnea and no nausea. She has tried nothing for the symptoms.    Past Medical History:  Diagnosis Date  . Headache(784.0)    migraines many yrs ago  . Heart murmur   . Hypertension     Patient Active Problem List   Diagnosis Date Noted  . Diarrhea 01/30/2013    Past Surgical History:  Procedure Laterality Date  . BREAST BIOPSY Left 10/07/2006   benign  . COLONOSCOPY WITH PROPOFOL N/A 01/11/2014   Procedure: COLONOSCOPY WITH  PROPOFOL;  Surgeon: Charolett Bumpers, MD;  Location: [facility];  Service: Endoscopy;  Laterality: N/A;  . TONSILLECTOMY       OB History   None      Home Medications    Prior to Admission medications   Medication Sig Start Date End Date Taking? Authorizing Provider  ALPRAZolam Prudy Feeler) 0.5 MG tablet Take 0.5 mg by mouth daily as needed for anxiety.  01/16/15  Yes [provider]  atenolol (TENORMIN) 25 MG tablet Take 12.5 mg by mouth every evening.    Yes [provider]  ezetimibe (ZETIA) 10 MG tablet Take 10 mg by mouth daily. 05/27/18  Yes [provider]  hydrochlorothiazide (HYDRODIURIL) 25 MG tablet Take 12.5 mg by mouth daily.  12/02/14  Yes [provider]  ibuprofen (ADVIL,MOTRIN) 200 MG tablet Take 400 mg by mouth every 6 (six) hours as needed for moderate pain.   Yes [provider]  Multiple Vitamins-Minerals (MULTIVITAMIN ADULT PO) Take 1 tablet by mouth daily.   Yes [provider]  Vitamin D, Ergocalciferol, (DRISDOL) 50000 units CAPS capsule Take 1 capsule by mouth once a week. 04/06/18  Yes [provider]  zolpidem (AMBIEN) 10 MG tablet Take 2.5 mg by mouth at bedtime as needed for sleep.    Yes [provider]    Family History History reviewed. No pertinent family history.  Social History Social History   Tobacco Use  . Smoking status:  Never Smoker  . Smokeless tobacco: Never Used  Substance Use Topics  . Alcohol use: Yes    Comment: occasionally   . Drug use: No     Allergies   Penicillins   Review of Systems Review of Systems  Constitutional: Negative for fever.  Cardiovascular: Negative for orthopnea.  Gastrointestinal: Negative for nausea.  Neurological: Positive for headaches.  All other systems reviewed and are negative.    Physical Exam Updated Vital Signs BP (!) 124/91   Pulse (!) 53   Temp 98.7 F (37.1 C) (Oral)   Resp 18   SpO2 98%   Physical Exam    Constitutional: She is oriented to person, place, and time. She appears well-developed and well-nourished. No distress.  HENT:  Head: Normocephalic and atraumatic.  Eyes: Pupils are equal, round, and reactive to light. EOM are normal.  Neck: Normal range of motion. Neck supple. No neck rigidity.  Cardiovascular: Normal rate and regular rhythm. Exam reveals no gallop and no friction rub.  No murmur heard. Pulmonary/Chest: Effort normal and breath sounds normal. No respiratory distress. She has no wheezes.  Abdominal: Soft. Bowel sounds are normal. She exhibits no distension. There is no tenderness.  Musculoskeletal: Normal range of motion.  Neurological: She is alert and oriented to person, place, and time. She has normal strength. No cranial nerve deficit. Coordination and gait normal.  Skin: Skin is warm and dry. She is not diaphoretic.  Nursing note and vitals reviewed.    ED Treatments / Results  Labs (all labs ordered are listed, but only abnormal results are displayed) Labs Reviewed  BASIC METABOLIC PANEL - Abnormal; Notable for the following components:      Result Value   Sodium 146 (*)    Potassium 3.0 (*)    All other components within normal limits  CBC  I-STAT TROPONIN, ED  I-STAT BETA HCG BLOOD, ED ([facility], [facility], [facility] ONLY)    EKG EKG Interpretation  Date/Time:  Thursday May 28 2018 00:13:05 EDT Ventricular Rate:  54 PR Interval:    QRS Duration: 90 QT Interval:  407 QTC Calculation: 386 R Axis:   22 Text Interpretation:  Sinus arrhythmia ST elev, probable normal early repol pattern Confirmed by Geoffery LyonseLo, Ilean Spradlin ([id number]) on 05/28/2018 12:18:24 AM   Radiology Dg Chest 2 View  Result Date: 05/28/2018 CLINICAL DATA:  Sudden onset tachycardia and shortness of breath tonight. EXAM: CHEST - 2 VIEW COMPARISON:  01/06/2016 FINDINGS: The heart size and mediastinal contours are within normal limits. Both lungs are clear. The visualized skeletal structures are unremarkable.  IMPRESSION: No active cardiopulmonary disease. Electronically Signed   By: Burman NievesWilliam  Stevens M.D.   On: 05/28/2018 00:50    Procedures Procedures (including critical care time)  Medications Ordered in ED Medications - No data to display   Initial Impression / Assessment and Plan / ED Course  I have reviewed the triage vital signs and the nursing notes.  Pertinent labs & imaging results that were available during my care of the patient were reviewed by me and considered in my medical decision making (see chart for details).  Patient presents here with complaints of dizziness and headache as described in the HPI.  Her neurologic exam is nonfocal and head CT is negative.  I see no evidence for an acute neurological event and do not feel as though any further work-up is indicated into this.  She also experienced an episode of palpitations this evening she has been in a sinus rhythm since  arriving here with a normal rate.  I am uncertain as to what caused her palpitations at home, however she has had no ectopy while here.  She does have a potassium of 3.0 and she will be prescribed replacement.  She will be discharged, to follow-up with her primary doctor if these episodes continue.  She may require a Holter monitor if she experiences additional palpitations.  Final Clinical Impressions(s) / ED Diagnoses   Final diagnoses:  None    ED Discharge Orders    None       Geoffery Lyons, MD 05/28/18 [phone]

## 2018-05-28 NOTE — Discharge Instructions (Addendum)
Begin taking potassium replacement as prescribed today.  Ibuprofen 600 mg every 6 hours as needed for pain.  Follow-up with your primary doctor if symptoms are not improving or if your palpitations return.

## 2018-06-01 ENCOUNTER — Emergency Department (HOSPITAL_COMMUNITY): Payer: 59

## 2018-06-01 ENCOUNTER — Encounter (HOSPITAL_COMMUNITY): Payer: Self-pay

## 2018-06-01 ENCOUNTER — Emergency Department (HOSPITAL_COMMUNITY)
Admission: EM | Admit: 2018-06-01 | Discharge: 2018-06-01 | Disposition: A | Discharge status: Home / Self Care | Payer: 59 | Attending: Emergency Medicine | Admitting: Emergency Medicine

## 2018-06-01 ENCOUNTER — Other Ambulatory Visit: Payer: Self-pay

## 2018-06-01 DIAGNOSIS — R200 Anesthesia of skin: Secondary | ICD-10-CM | POA: Diagnosis present

## 2018-06-01 DIAGNOSIS — G459 Transient cerebral ischemic attack, unspecified: Secondary | ICD-10-CM | POA: Diagnosis not present

## 2018-06-01 DIAGNOSIS — R42 Dizziness and giddiness: Secondary | ICD-10-CM | POA: Diagnosis not present

## 2018-06-01 DIAGNOSIS — Z79899 Other long term (current) drug therapy: Secondary | ICD-10-CM | POA: Insufficient documentation

## 2018-06-01 DIAGNOSIS — I10 Essential (primary) hypertension: Secondary | ICD-10-CM | POA: Diagnosis not present

## 2018-06-01 LAB — BASIC METABOLIC PANEL
Anion gap: 9 (ref 5–15)
BUN: 13 mg/dL (ref 6–20)
CALCIUM: 9.8 mg/dL (ref 8.9–10.3)
CO2: 29 mmol/L (ref 22–32)
CREATININE: 0.92 mg/dL (ref 0.44–1.00)
Chloride: 107 mmol/L (ref 98–111)
GFR calc non Af Amer: >60 mL/min (ref >60)
GLUCOSE: 106 mg/dL — AB (ref 70–99)
Potassium: 3.9 mmol/L (ref 3.5–5.1)
Sodium: 145 mmol/L (ref 135–145)

## 2018-06-01 LAB — CBC WITH DIFFERENTIAL/PLATELET
BASOS PCT: 0 %
Basophils Absolute: 0 10*3/uL (ref 0.0–0.1)
Eosinophils Absolute: 0 10*3/uL (ref 0.0–0.7)
Eosinophils Relative: 0 %
HEMATOCRIT: 41.9 % (ref 36.0–46.0)
HEMOGLOBIN: 14 g/dL (ref 12.0–15.0)
LYMPHS ABS: 1.8 10*3/uL (ref 0.7–4.0)
LYMPHS PCT: 26 %
MCH: 30.6 pg (ref 26.0–34.0)
MCHC: 33.4 g/dL (ref 30.0–36.0)
MCV: 91.5 fL (ref 78.0–100.0)
MONO ABS: 0.4 10*3/uL (ref 0.1–1.0)
Monocytes Relative: 5 %
NEUTROS ABS: 4.9 10*3/uL (ref 1.7–7.7)
Neutrophils Relative %: 69 %
Platelets: 288 10*3/uL (ref 150–400)
RBC: 4.58 MIL/uL (ref 3.87–5.11)
RDW: 12.6 % (ref 11.5–15.5)
WBC: 7.1 10*3/uL (ref 4.0–10.5)

## 2018-06-01 LAB — MAGNESIUM: Magnesium: 2.1 mg/dL (ref 1.7–2.4)

## 2018-06-01 MED ORDER — LORAZEPAM 2 MG/ML IJ SOLN
2.0000 mg | Freq: Once | INTRAMUSCULAR | Status: AC
  Administered 2018-06-01: 2 mg via INTRAVENOUS
  Filled 2018-06-01: qty 1

## 2018-06-01 MED ORDER — POTASSIUM CHLORIDE CRYS ER 20 MEQ PO TBCR
40.0000 meq | EXTENDED_RELEASE_TABLET | Freq: Once | ORAL | Status: AC
  Administered 2018-06-01: 40 meq via ORAL
  Filled 2018-06-01: qty 2

## 2018-06-01 NOTE — ED Notes (Signed)
Per MRI Pt unable to complete scan due to agitation and extreme claustrophobia

## 2018-06-01 NOTE — ED Provider Notes (Signed)
[facility]-EMERGENCY DEPT Provider Note   CSN: [id number] Arrival date & time: 06/01/18  9562     History   Chief Complaint Chief Complaint  Patient presents with  . Numbness    HPI Caitlyn Wilson is a 52 y.o. female.  HPI 52 year old female comes in with chief complaint of numbness.  Patient has history of hypertension, hyperlipidemia and headaches.  She also has been suffering through vertigo over the past few days.  Patient reports that she woke up this morning with right-sided numbness to the thigh.  Patient also had some numbness to her right face.  Patient called the nursing line and she was advised to come to the ER for immediate work-up.  Patient denies any focal weakness, current dizziness, vision changes, slurred speech.  No history of similar symptoms in the past and no history of stroke.  Patient denies any heavy smoking, substance abuse or drug use.  She is not taking any new medications.  Past Medical History:  Diagnosis Date  . Headache(784.0)    migraines many yrs ago  . Heart murmur   . Hypertension     Patient Active Problem List   Diagnosis Date Noted  . Diarrhea 01/30/2013    Past Surgical History:  Procedure Laterality Date  . BREAST BIOPSY Left 10/07/2006   benign  . COLONOSCOPY WITH PROPOFOL N/A 01/11/2014   Procedure: COLONOSCOPY WITH PROPOFOL;  Surgeon: Charolett Bumpers, MD;  Location: [facility];  Service: Endoscopy;  Laterality: N/A;  . TONSILLECTOMY       OB History   None      Home Medications    Prior to Admission medications   Medication Sig Start Date End Date Taking? Authorizing Provider  ALPRAZolam Prudy Feeler) 0.5 MG tablet Take 0.125 mg by mouth daily as needed for anxiety.  01/16/15  Yes [provider]  atenolol (TENORMIN) 25 MG tablet Take 12.5 mg by mouth every evening.    Yes [provider]  ezetimibe (ZETIA) 10 MG tablet Take 10 mg by mouth daily. 05/27/18  Yes [provider]  hydrochlorothiazide (HYDRODIURIL) 25 MG tablet Take 12.5 mg by mouth daily.  12/02/14  Yes [provider]  ibuprofen (ADVIL,MOTRIN) 200 MG tablet Take 400 mg by mouth every 6 (six) hours as needed for moderate pain.   Yes [provider]  Multiple Vitamins-Minerals (MULTIVITAMIN ADULT PO) Take 1 tablet by mouth daily.   Yes [provider]  omeprazole (PRILOSEC) 20 MG capsule Take 20 mg by mouth daily as needed (heartburn).   Yes [provider]  Vitamin D, Ergocalciferol, (DRISDOL) 50000 units CAPS capsule Take 1 capsule by mouth once a week. Thursday or Friday 04/06/18  Yes [provider]  zolpidem (AMBIEN) 10 MG tablet Take 2.5 mg by mouth at bedtime as needed for sleep.    Yes [provider]  potassium chloride (K-DUR) 10 MEQ tablet Take 1 tablet (10 mEq total) by mouth 2 (two) times daily. 05/28/18   Geoffery Lyons, MD    Family History History reviewed. No pertinent family history.  Social History Social History   Tobacco Use  . Smoking status: Never Smoker  . Smokeless tobacco: Never Used  Substance Use Topics  . Alcohol use: Yes    Comment: occasionally   . Drug use: No     Allergies   Amoxicillin and Penicillins   Review of Systems Review of Systems  Constitutional: Positive for activity change.  Respiratory: Negative for shortness  of breath.   Cardiovascular: Negative for chest pain.  Gastrointestinal: Negative for nausea.  Neurological: Positive for numbness. Negative for dizziness, seizures, facial asymmetry, speech difficulty, weakness, light-headedness and headaches.  All other systems reviewed and are negative.    Physical Exam Updated Vital Signs BP 124/88   Pulse 66   Resp 16   Ht 5' (1.524 m)   Wt 83.9 kg   SpO2 99%   BMI 36.13 kg/m   Physical Exam  Constitutional: She is oriented to person, place, and time. She appears well-developed.  HENT:  Head: Normocephalic and atraumatic.  Eyes:  EOM are normal.  Neck: Normal range of motion. Neck supple.  Cardiovascular: Normal rate.  Pulmonary/Chest: Effort normal.  Abdominal: Bowel sounds are normal.  Neurological: She is alert and oriented to person, place, and time.  Skin: Skin is warm and dry.  Nursing note and vitals reviewed.    ED Treatments / Results  Labs (all labs ordered are listed, but only abnormal results are displayed) Labs Reviewed  BASIC METABOLIC PANEL - Abnormal; Notable for the following components:      Result Value   Glucose, Bld 106 (*)    All other components within normal limits  CBC WITH DIFFERENTIAL/PLATELET  MAGNESIUM    EKG EKG Interpretation  Date/Time:  Monday June 01 2018 08:26:36 EDT Ventricular Rate:  60 PR Interval:    QRS Duration: 88 QT Interval:  405 QTC Calculation: 405 R Axis:   20 Text Interpretation:  Sinus rhythm ST elev, probable normal early repol pattern U waves in v4-v6 appreciated, new Confirmed by Derwood Kaplananavati, Taelynn Mcelhannon ([id number]) on 06/01/2018 9:25:50 AM   Radiology Mr Brain Wo Contrast  Result Date: 06/01/2018 CLINICAL DATA:  RIGHT side numbness beginning this morning. Assess for stroke. History of hypertension and headache. EXAM: MRI HEAD WITHOUT CONTRAST TECHNIQUE: Multiplanar, multiecho pulse sequences of the brain and surrounding structures were obtained without intravenous contrast. COMPARISON:  CT HEAD May 28, 2018 and MRI head August 10, 2016. FINDINGS: INTRACRANIAL CONTENTS: No reduced diffusion to suggest acute ischemia or hyperacute demyelination. No susceptibility artifact to suggest hemorrhage. The ventricles and sulci are normal for patient's age. No suspicious parenchymal signal, masses, mass effect. No abnormal extra-axial fluid collections. No extra-axial masses. 8 mm pineal cyst. Cerebellar tonsils descend 5 mm below the foramen magnum, effaced cerebral spinal fluid with mild ventral indentation cervicomedullary junction. Mildly effaced prepontine  cistern. VASCULAR: Normal major intracranial vascular flow voids present at skull base. SKULL AND UPPER CERVICAL SPINE: Expanded partially empty sella. No suspicious calvarial bone marrow signal. Craniocervical junction maintained. SINUSES/ORBITS: The mastoid air-cells and included paranasal sinuses are well-aerated.The included ocular globes and orbital contents are non-suspicious. OTHER: None. IMPRESSION: 1. No acute intracranial process. 2. Similar findings of empty sella and borderline low-lying cerebellar tonsils representing mixed presentation of intracranial pressure changes. Consider non emergent neurological consultation if clinically indicated. Electronically Signed   By: Awilda Metroourtnay  Bloomer M.D.   On: 06/01/2018 14:03    Procedures Procedures (including critical care time)  Medications Ordered in ED Medications  potassium chloride SA (K-DUR,KLOR-CON) CR tablet 40 mEq (40 mEq Oral Given 06/01/18 0949)  LORazepam (ATIVAN) injection 2 mg (2 mg Intravenous Given 06/01/18 1310)     Initial Impression / Assessment and Plan / ED Course  I have reviewed the triage vital signs and the nursing notes.  Pertinent labs & imaging results that were available during my care of the patient were reviewed by me and considered in my medical  decision making (see chart for details).  Clinical Course as of Jun 02 1431  Mon Jun 01, 2018  1428 No acute stroke. Nonspecific findings that could signify elevated intracranial pressure.  Patient has been made aware of those MRI findings and she has been advised to follow-up with neurology for further work-up.  MR BRAIN WO CONTRAST [AN]  1431 Patient's numbness have resolved over the face and almost resolved over the thigh, raising the question that this could be a TIA like event.  We will still carry on with a neurology follow-up.   [AN]    Clinical Course User Index [AN] Derwood Kaplan, MD    52 year old female comes in with chief complaint of numbness.   Patient has history of hypertension and hyperlipidemia.  She also has been struggling with vertigo for the past few weeks.  Patient is complaining of right-sided numbness to her thigh and her face.  There is no family history of MS and patient denies any similar type of symptoms in the past.  Differential diagnosis will include TIA, small infarct of the brain.  I do not think patient has intracranial bleed.  MRI has been ordered.   Final Clinical Impressions(s) / ED Diagnoses   Final diagnoses:  Numbness of left anterior thigh  TIA (transient ischemic attack)    ED Discharge Orders    None       Derwood Kaplan, MD 06/01/18 1432

## 2018-06-01 NOTE — Discharge Instructions (Addendum)
We saw you in the ER for the numbness. All the results in the ER are normal, labs and imaging. No signs of stroke. We are not sure what is causing your symptoms. The workup in the ER is not complete, and is limited to screening for life threatening and emergent conditions only, so please see a Neurologist for further evaluation. We also recommend that you start taking over the counter BABY DOSE of aspirin.

## 2018-06-01 NOTE — ED Triage Notes (Signed)
Pt c/o numbness on right thigh since this morning. Denies pain or weakness.

## 2018-06-01 NOTE — Progress Notes (Signed)
Attempted MRI at 1205 but pt was crying and unable to get scan done. Spoke with Rn who will talk to the Dr about what to do next. Pt will be sent back to room.

## 2018-07-02 ENCOUNTER — Emergency Department (HOSPITAL_COMMUNITY)
Admission: EM | Admit: 2018-07-02 | Discharge: 2018-07-03 | Discharge status: Against Medical Advice | Payer: 59 | Attending: Emergency Medicine | Admitting: Emergency Medicine

## 2018-07-02 DIAGNOSIS — Z5321 Procedure and treatment not carried out due to patient leaving prior to being seen by health care provider: Secondary | ICD-10-CM | POA: Insufficient documentation

## 2018-07-02 DIAGNOSIS — R42 Dizziness and giddiness: Secondary | ICD-10-CM | POA: Diagnosis present

## 2018-07-03 ENCOUNTER — Other Ambulatory Visit: Payer: Self-pay

## 2018-07-03 ENCOUNTER — Encounter (HOSPITAL_COMMUNITY): Payer: Self-pay | Admitting: Emergency Medicine

## 2018-07-03 NOTE — ED Triage Notes (Addendum)
Pt from home with c/o dizziness and "feeling like she is shaking". Pt is not tremulous at time of assessment. Pt states she had an MRI and was told "two things are wrong" but she was not sure what. Pt has symmetrical neuro exam but has poor fine motor skills. Pt has been evaulated for vertigo many times

## 2018-07-03 NOTE — ED Notes (Signed)
Pt stated she preferred to be seen by her primary care doctor as her symptoms have gone away. Pt was explained the risks of leaving without being evaluated by EDP.

## 2018-07-06 ENCOUNTER — Encounter: Payer: Self-pay | Admitting: *Deleted

## 2018-07-07 ENCOUNTER — Encounter: Payer: Self-pay | Admitting: Diagnostic Neuroimaging

## 2018-07-07 ENCOUNTER — Ambulatory Visit (INDEPENDENT_AMBULATORY_CARE_PROVIDER_SITE_OTHER): Payer: 59 | Admitting: Diagnostic Neuroimaging

## 2018-07-07 VITALS — BP 140/90 | HR 58 | Ht 60.0 in | Wt 191.4 lb

## 2018-07-07 DIAGNOSIS — E236 Other disorders of pituitary gland: Secondary | ICD-10-CM | POA: Diagnosis not present

## 2018-07-07 DIAGNOSIS — G5711 Meralgia paresthetica, right lower limb: Secondary | ICD-10-CM | POA: Diagnosis not present

## 2018-07-07 DIAGNOSIS — H8111 Benign paroxysmal vertigo, right ear: Secondary | ICD-10-CM

## 2018-07-07 DIAGNOSIS — G43109 Migraine with aura, not intractable, without status migrainosus: Secondary | ICD-10-CM

## 2018-07-07 NOTE — Progress Notes (Signed)
[facility]  PATIENT: Caitlyn Wilson DOB: [birth year]  REFERRING CLINICIAN: S Furr HISTORY FROM: patient  REASON FOR VISIT: new consult    HISTORICAL  CHIEF COMPLAINT:  Chief Complaint  Patient presents with  . Empty sella syndrome    rm 7, New Pt    HISTORY OF PRESENT ILLNESS:   52 year old female here for evaluation of empty sella syndrome.  August 2019 patient woke up with severe vertigo, room spinning, nausea, vomiting, tinnitus.  Patient went to the hospital for evaluation and was diagnosed with peripheral positional vertigo.  Patient then returned to the emergency room in September due to headaches and rapid heart rate.  She returned again a few days later with right face and right thigh numbness sensation.  MRI of the brain was obtained which showed partially empty sella syndrome and mild cerebellar tonsillar ectopia.  Patient was discharged with recommendation to follow-up in neurology clinic.  Since then symptoms have improved.  Patient does have prior history of migraine headaches in her 8s and 30s with severe global headaches, vision changes, photophobia, osmophobia.  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  Numbness, headache, dizziness, vertigo symptoms have resolved.    REVIEW OF SYSTEMS: Full 14 system review of systems performed and negative with exception of: Anxiety increased thirst blurred vision.   ALLERGIES: Allergies  Allergen Reactions  . Amoxicillin Rash    Has patient had a PCN reaction causing immediate rash, facial/tongue/throat swelling, SOB or lightheadedness with hypotension: Yes Has patient had a PCN reaction causing severe rash involving mucus membranes or skin necrosis: No Has patient had a PCN reaction that required hospitalization: No Has patient had a PCN reaction occurring within the last 10 years: Yes If all of the  above answers are "NO", then may proceed with Cephalosporin use.  Marland Kitchen Penicillins Rash    Allergic to all cillins Has patient had a PCN reaction causing immediate rash, facial/tongue/throat swelling, SOB or lightheadedness with hypotension: yes Has patient had a PCN reaction causing severe rash involving mucus membranes or skin necrosis: no Has patient had a PCN reaction that required hospitalization: no Has patient had a PCN reaction occurring within the last 10 years: yes If all of the above answers are "NO", then may proceed with Cephalosporin use.     HOME MEDICATIONS: Outpatient Medications Prior to Visit  Medication Sig Dispense Refill  . ALPRAZolam (XANAX) 0.5 MG tablet Take 0.125 mg by mouth daily as needed for anxiety.     Marland Kitchen atenolol (TENORMIN) 25 MG tablet Take 12.5 mg by mouth every evening.     . ezetimibe (ZETIA) 10 MG tablet Take 10 mg by mouth daily.    . hydrochlorothiazide (HYDRODIURIL) 25 MG tablet Take 12.5 mg by mouth daily.   12  . ibuprofen (ADVIL,MOTRIN) 200 MG tablet Take 400 mg by mouth every 6 (six) hours as needed for moderate pain.    . Multiple Vitamins-Minerals (MULTIVITAMIN ADULT PO) Take 1 tablet by mouth daily.    Marland Kitchen omeprazole (PRILOSEC) 20 MG capsule Take 20 mg by mouth daily as needed (heartburn).    . potassium chloride (K-DUR) 10 MEQ tablet Take 1 tablet (10 mEq total) by mouth 2 (two) times daily. 15 tablet 0  . Vitamin D, Ergocalciferol, (DRISDOL) 50000 units CAPS capsule Take 1 capsule by mouth once a week. Thursday or Friday  3  . zolpidem (AMBIEN) 10 MG  tablet Take 2.5 mg by mouth at bedtime as needed for sleep.      No facility-administered medications prior to visit.     PAST MEDICAL HISTORY: Past Medical History:  Diagnosis Date  . Empty sella syndrome (HCC)   . GERD (gastroesophageal reflux disease)   . Headache(784.0)    migraines many yrs ago  . Heart murmur   . Hypercholesteremia   . Hypertension   . Vertigo     PAST SURGICAL  HISTORY: Past Surgical History:  Procedure Laterality Date  . BREAST BIOPSY Left 10/07/2006   benign  . COLONOSCOPY WITH PROPOFOL N/A 01/11/2014   Procedure: COLONOSCOPY WITH PROPOFOL;  Surgeon: Charolett Bumpers, MD;  Location: [facility];  Service: Endoscopy;  Laterality: N/A;  . TONSILLECTOMY      FAMILY HISTORY: No family history on file.  SOCIAL HISTORY: Social History   Socioeconomic History  . Marital status: Single    Spouse name: Not on file  . Number of children: 1  . Years of education: 44  . Highest education level: Not on file  Occupational History  . Not on file  Social Needs  . Financial resource strain: Not on file  . Food insecurity:    Worry: Not on file    Inability: Not on file  . Transportation needs:    Medical: Not on file    Non-medical: Not on file  Tobacco Use  . Smoking status: Never Smoker  . Smokeless tobacco: Never Used  Substance and Sexual Activity  . Alcohol use: Yes    Comment: occasionally   . Drug use: No  . Sexual activity: Not on file  Lifestyle  . Physical activity:    Days per week: Not on file    Minutes per session: Not on file  . Stress: Not on file  Relationships  . Social connections:    Talks on phone: Not on file    Gets together: Not on file    Attends religious service: Not on file    Active member of club or organization: Not on file    Attends meetings of clubs or organizations: Not on file    Relationship status: Not on file  . Intimate partner violence:    Fear of current or ex partner: Not on file    Emotionally abused: Not on file    Physically abused: Not on file    Forced sexual activity: Not on file  Other Topics Concern  . Not on file  Social History Narrative   Lives alone   Caffeine- none     PHYSICAL EXAM  GENERAL EXAM/CONSTITUTIONAL: Vitals:  Vitals:   07/07/18 1507  BP: 140/90  Pulse: (!) 58  Weight: 191 lb 6.4 oz (86.8 kg)  Height: 5' (1.524 m)     Body mass index is 37.38  kg/m. Wt Readings from Last 3 Encounters:  07/07/18 191 lb 6.4 oz (86.8 kg)  06/01/18 185 lb (83.9 kg)  04/29/18 185 lb (83.9 kg)     Patient is in no distress; well developed, nourished and groomed; neck is supple  CARDIOVASCULAR:  Examination of carotid arteries is normal; no carotid bruits  Regular rate and rhythm, no murmurs  Examination of peripheral vascular system by observation and palpation is normal  EYES:  Ophthalmoscopic exam of optic discs and posterior segments is normal; no papilledema or hemorrhages  Visual Acuity Screening   Right eye Left eye Both eyes  Without correction:     With correction:  20/30 20/30      MUSCULOSKELETAL:  Gait, strength, tone, movements noted in Neurologic exam below  NEUROLOGIC: MENTAL STATUS:  No flowsheet data found.  awake, alert, oriented to person, place and time  recent and remote memory intact  normal attention and concentration  language fluent, comprehension intact, naming intact  fund of knowledge appropriate  CRANIAL NERVE:   2nd - no papilledema on fundoscopic exam  2nd, 3rd, 4th, 6th - pupils equal and reactive to light, visual fields full to confrontation, extraocular muscles intact, no nystagmus  5th - facial sensation symmetric  7th - facial strength symmetric  8th - hearing intact  9th - palate elevates symmetrically, uvula midline  11th - shoulder shrug symmetric  12th - tongue protrusion midline  MOTOR:   normal bulk and tone, full strength in the BUE, BLE  SENSORY:   normal and symmetric to light touch, pinprick, temperature, vibration  COORDINATION:   finger-nose-finger, fine finger movements normal  REFLEXES:   deep tendon reflexes present and symmetric  GAIT/STATION:   narrow based gait     DIAGNOSTIC DATA (LABS, IMAGING, TESTING) - I reviewed patient records, labs, notes, testing and imaging myself where available.  Lab Results  Component Value Date   WBC 7.1  06/01/2018   HGB 14.0 06/01/2018   HCT 41.9 06/01/2018   MCV 91.5 06/01/2018   PLT 288 06/01/2018      Component Value Date/Time   NA 145 06/01/2018 0947   K 3.9 06/01/2018 0947   CL 107 06/01/2018 0947   CO2 29 06/01/2018 0947   GLUCOSE 106 (H) 06/01/2018 0947   BUN 13 06/01/2018 0947   CREATININE 0.92 06/01/2018 0947   CALCIUM 9.8 06/01/2018 0947   PROT 8.3 (H) 02/10/2015 1042   ALBUMIN 4.2 02/10/2015 1042   AST 28 02/10/2015 1042   ALT 16 02/10/2015 1042   ALKPHOS 49 02/10/2015 1042   BILITOT 0.3 02/10/2015 1042   GFRNONAA >60 06/01/2018 0947   GFRAA >60 06/01/2018 0947   No results found for: CHOL, HDL, LDLCALC, LDLDIRECT, TRIG, CHOLHDL No results found for: ZOXW9U No results found for: VITAMINB12 No results found for: TSH   06/01/18 MRI brain (without) [I reviewed images myself and agree with interpretation. I reviewed imaging with patient as well. Stable compared to 2017 MRI. -VRP]: 1. No acute intracranial process. 2. Similar findings of empty sella and borderline low-lying cerebellar tonsils representing mixed presentation of intracranial pressure changes. Consider non emergent neurological consultation if clinically indicated.    ASSESSMENT AND PLAN  52 y.o. year old female here with constellation of symptoms and MRI findings which I reviewed and summarized as below.  Dx:  1. Empty sella (HCC)   2. Benign paroxysmal positional vertigo of right ear   3. Migraine with aura and without status migrainosus, not intractable   4. Meralgia paresthetica of right side      PLAN:  PARTIALLY EMPTY SELLA SYNDROME (likely incidental finding; idiopathic intracranial hypertension (pseudotumor cerebri) is possible but less likely) - monitor for now - follow up with ophthalmology evaluation (rule out papilledema) - then may consider LP and opening pressure if papilledema is confirmed  RIGHT THIGH NUMBNESS (likely meralgia paresthetica) - monitor; stretching and  PT  POSITIONAL VERTIGO - improved; monitor  MIGRAINE WITH AURA - stable   Return if symptoms worsen or fail to improve.    Suanne Marker, MD 07/07/2018, 3:47 PM Certified in Neurology, Neurophysiology and Neuroimaging  [facility] [address]  [place], [place] [address] [phone]

## 2018-07-07 NOTE — Patient Instructions (Signed)
EMPTY SELLA SYNDROME (incidental finding vs idiopathic intracranial hypertension (pseudotumor cerebri)) - monitor for now - follow up with ophthalmology evaluation (rule out papilledema) - then may consider LP and opening pressure  RIGHT THIGH NUMBNESS (? Meralgia paresthetica) - monitor; stretching and PT exercises

## 2018-08-10 ENCOUNTER — Other Ambulatory Visit: Payer: Self-pay

## 2018-08-10 ENCOUNTER — Emergency Department (HOSPITAL_COMMUNITY)
Admission: EM | Admit: 2018-08-10 | Discharge: 2018-08-10 | Disposition: A | Discharge status: Home / Self Care | Payer: 59 | Attending: Emergency Medicine | Admitting: Emergency Medicine

## 2018-08-10 ENCOUNTER — Encounter (HOSPITAL_COMMUNITY): Payer: Self-pay

## 2018-08-10 ENCOUNTER — Emergency Department (HOSPITAL_COMMUNITY): Payer: 59

## 2018-08-10 DIAGNOSIS — R1013 Epigastric pain: Secondary | ICD-10-CM | POA: Insufficient documentation

## 2018-08-10 DIAGNOSIS — Z79899 Other long term (current) drug therapy: Secondary | ICD-10-CM | POA: Insufficient documentation

## 2018-08-10 DIAGNOSIS — J90 Pleural effusion, not elsewhere classified: Secondary | ICD-10-CM | POA: Diagnosis not present

## 2018-08-10 DIAGNOSIS — I10 Essential (primary) hypertension: Secondary | ICD-10-CM | POA: Insufficient documentation

## 2018-08-10 DIAGNOSIS — R5383 Other fatigue: Secondary | ICD-10-CM

## 2018-08-10 DIAGNOSIS — R55 Syncope and collapse: Secondary | ICD-10-CM | POA: Diagnosis present

## 2018-08-10 LAB — URINALYSIS, ROUTINE W REFLEX MICROSCOPIC
Bacteria, UA: NONE SEEN
Bilirubin Urine: NEGATIVE
Glucose, UA: NEGATIVE mg/dL
Ketones, ur: 20 mg/dL — AB
Nitrite: NEGATIVE
Protein, ur: NEGATIVE mg/dL
Specific Gravity, Urine: 1.01 (ref 1.005–1.030)
pH: 5 (ref 5.0–8.0)

## 2018-08-10 LAB — BASIC METABOLIC PANEL
Anion gap: 13 (ref 5–15)
BUN: 11 mg/dL (ref 6–20)
CO2: 25 mmol/L (ref 22–32)
Calcium: 10 mg/dL (ref 8.9–10.3)
Chloride: 104 mmol/L (ref 98–111)
Creatinine, Ser: 0.93 mg/dL (ref 0.44–1.00)
GFR calc Af Amer: >60 mL/min (ref >60)
GFR calc non Af Amer: >60 mL/min (ref >60)
Glucose, Bld: 97 mg/dL (ref 70–99)
Potassium: 3.8 mmol/L (ref 3.5–5.1)
Sodium: 142 mmol/L (ref 135–145)

## 2018-08-10 LAB — CBC
HCT: 49.7 % — ABNORMAL HIGH (ref 36.0–46.0)
Hemoglobin: 16 g/dL — ABNORMAL HIGH (ref 12.0–15.0)
MCH: 30.3 pg (ref 26.0–34.0)
MCHC: 32.2 g/dL (ref 30.0–36.0)
MCV: 94.1 fL (ref 80.0–100.0)
Platelets: 283 10*3/uL (ref 150–400)
RBC: 5.28 MIL/uL — ABNORMAL HIGH (ref 3.87–5.11)
RDW: 12.3 % (ref 11.5–15.5)
WBC: 9.4 10*3/uL (ref 4.0–10.5)
nRBC: 0 % (ref 0.0–0.2)

## 2018-08-10 NOTE — Discharge Instructions (Signed)
Please follow up with your doctor °Return if you are worsening °

## 2018-08-10 NOTE — ED Notes (Signed)
US attempted to gain IV access, no success.

## 2018-08-10 NOTE — ED Notes (Signed)
Attempted IV start x3, no success.  US IV placement requested.

## 2018-08-10 NOTE — ED Provider Notes (Signed)
[facility]-EMERGENCY DEPT Provider Note   CSN: [id number] Arrival date & time: 08/10/18  1107     History   Chief Complaint Chief Complaint  Patient presents with  . Near Syncope  . Weakness    HPI Caitlyn Wilson is a 52 y.o. female who presents with near syncope.  Past medical history significant for vertigo, hypertension, hyperlipidemia, insomnia, anxiety.  Patient states that over the past couple of weeks she is felt fatigued, not had an appetite, has had nausea.  On Thursday during Thanksgiving she states she did not eat anything and she had acute onset of lightheadedness and feeling like she was cannot pass out.  She went to go lie down and called a nurses line and they advised her to take a Xanax and follow-up with her doctor.  She has seen her primary care provider for this problem as well.  She has had extensive blood work testing which all came back normal.  She went to urgent care on 11/29 and they advised her to do Holter monitoring.  She states that she has been having issues with vertigo over the past couple months however this does not feel similar.  She did physical therapy which resolved her vertigo.  This weekend she said she did not have any energy and just laid in bed all weekend.  Today she decided to get up and do some errands.  She states she got out of her car and went over to the passenger side to open the door and became lightheaded and felt like she is going to pass out again.  She had her son drive her to urgent care however they said that she should go to the emergency department because there was nothing else they could do for her there.  She denies headache, chest pain, shortness of breath, abdominal pain, vomiting, change in bowels, urinary symptoms.  She does have intermittent mild epigastric abdominal pain which she will have randomly. She does not have this currently.  HPI  Past Medical History:  Diagnosis Date  . Empty sella syndrome  (HCC)   . GERD (gastroesophageal reflux disease)   . Headache(784.0)    migraines many yrs ago  . Heart murmur   . Hypercholesteremia   . Hypertension   . Vertigo     Patient Active Problem List   Diagnosis Date Noted  . Diarrhea 01/30/2013    Past Surgical History:  Procedure Laterality Date  . BREAST BIOPSY Left 10/07/2006   benign  . COLONOSCOPY WITH PROPOFOL N/A 01/11/2014   Procedure: COLONOSCOPY WITH PROPOFOL;  Surgeon: Charolett BumpersMartin K Johnson, MD;  Location: [facility];  Service: Endoscopy;  Laterality: N/A;  . TONSILLECTOMY       OB History   None      Home Medications    Prior to Admission medications   Medication Sig Start Date End Date Taking? Authorizing Provider  ALPRAZolam Prudy Feeler(XANAX) 0.5 MG tablet Take 0.125 mg by mouth daily as needed for anxiety.  01/16/15   [provider]  atenolol (TENORMIN) 25 MG tablet Take 12.5 mg by mouth every evening.     [provider]  ezetimibe (ZETIA) 10 MG tablet Take 10 mg by mouth daily. 05/27/18   [provider]  hydrochlorothiazide (HYDRODIURIL) 25 MG tablet Take 12.5 mg by mouth daily.  12/02/14   [provider]  ibuprofen (ADVIL,MOTRIN) 200 MG tablet Take 400 mg by mouth every 6 (six) hours as needed for moderate pain.  [provider]  Multiple Vitamins-Minerals (MULTIVITAMIN ADULT PO) Take 1 tablet by mouth daily.    [provider]  omeprazole (PRILOSEC) 20 MG capsule Take 20 mg by mouth daily as needed (heartburn).    [provider]  potassium chloride (K-DUR) 10 MEQ tablet Take 1 tablet (10 mEq total) by mouth 2 (two) times daily. 05/28/18   Geoffery Lyons, MD  Vitamin D, Ergocalciferol, (DRISDOL) 50000 units CAPS capsule Take 1 capsule by mouth once a week. Thursday or Friday 04/06/18   [provider]  zolpidem (AMBIEN) 10 MG tablet Take 2.5 mg by mouth at bedtime as needed for sleep.     [provider]    Family History History reviewed. No  pertinent family history.  Social History Social History   Tobacco Use  . Smoking status: Never Smoker  . Smokeless tobacco: Never Used  Substance Use Topics  . Alcohol use: Yes    Comment: occasionally   . Drug use: No     Allergies   Amoxicillin and Penicillins   Review of Systems Review of Systems  Constitutional: Positive for activity change, appetite change and fatigue. Negative for fever.  Respiratory: Negative for shortness of breath.   Cardiovascular: Negative for chest pain.  Gastrointestinal: Negative for abdominal pain.  Genitourinary: Negative for dysuria.  Neurological: Positive for weakness and light-headedness. Negative for syncope and headaches.  All other systems reviewed and are negative.    Physical Exam Updated Vital Signs BP (!) 149/96 (BP Location: Left Arm)   Pulse (!) 55   Temp 98 F (36.7 C) (Oral)   Resp 16   Ht 5' (1.524 m)   Wt 81.6 kg   SpO2 100%   BMI 35.15 kg/m   Physical Exam  Constitutional: She is oriented to person, place, and time. She appears well-developed and well-nourished. No distress.  Calm and cooperative  HENT:  Head: Normocephalic and atraumatic.  Eyes: Pupils are equal, round, and reactive to light. Conjunctivae are normal. Right eye exhibits no discharge. Left eye exhibits no discharge. No scleral icterus.  Neck: Normal range of motion.  Cardiovascular: Normal rate and regular rhythm.  Pulmonary/Chest: Effort normal and breath sounds normal. No respiratory distress.  Abdominal: Soft. Bowel sounds are normal. She exhibits no distension and no mass. There is tenderness (mild epigastric tenderness). There is no rebound and no guarding. No hernia.  Neurological: She is alert and oriented to person, place, and time.  Skin: Skin is warm and dry.  Psychiatric: She has a normal mood and affect. Her behavior is normal.  Nursing note and vitals reviewed.    ED Treatments / Results  Labs (all labs ordered are listed,  but only abnormal results are displayed) Labs Reviewed  CBC - Abnormal; Notable for the following components:      Result Value   RBC 5.28 (*)    Hemoglobin 16.0 (*)    HCT 49.7 (*)    All other components within normal limits  URINALYSIS, ROUTINE W REFLEX MICROSCOPIC - Abnormal; Notable for the following components:   Hgb urine dipstick MODERATE (*)    Ketones, ur 20 (*)    Leukocytes, UA SMALL (*)    All other components within normal limits  BASIC METABOLIC PANEL  CBG MONITORING, ED    EKG None  Radiology US Abdomen Complete  Result Date: 08/10/2018 CLINICAL DATA:  Abdominal pain. EXAM: ABDOMEN ULTRASOUND COMPLETE COMPARISON:  None. FINDINGS: Gallbladder: No gallstones or wall thickening visualized. No sonographic Eulah Pont  sign noted by sonographer. Common bile duct: Diameter: 3.8 mm Liver: No focal lesion identified. Within normal limits in parenchymal echogenicity. Portal vein is patent on color Doppler imaging with normal direction of blood flow towards the liver. IVC: No abnormality visualized. Pancreas: Visualized portion unremarkable. Spleen: Size and appearance within normal limits. Right Kidney: Length: 8.9 cm. Echogenicity within normal limits. No mass or hydronephrosis visualized. Left Kidney: Length: 8.7 cm. Echogenicity within normal limits. No mass or hydronephrosis visualized. Abdominal aorta: No aneurysm visualized. Other findings: Right pleural effusion. IMPRESSION: 1. Right pleural effusion. 2. No acute intra-abdominal abnormalities. Electronically Signed   By: Gerome Sam III M.D   On: 08/10/2018 16:03    Procedures Procedures (including critical care time)  Medications Ordered in ED Medications - No data to display   Initial Impression / Assessment and Plan / ED Course  I have reviewed the triage vital signs and the nursing notes.  Pertinent labs & imaging results that were available during my care of the patient were reviewed by me and considered in my  medical decision making (see chart for details).  52 year old female presents with near syncope. She has had a lot of symptoms recently which are vague in nature including fatigue, generalized weakness, change in appetite, dizziness. She has had close f/u with her PCP regarding this symptoms and extensive blood work testing which has been normal. Her EKG is SR and appears unchanged. CBC is remarkable for mild elevation of hgb. She also has 20 ketones in urine which may represent dehydration. BMP is normal. Orthostatics are negative.  Orthostatic VS for the past 24 hrs:  BP- Lying Pulse- Lying BP- Sitting Pulse- Sitting BP- Standing at 0 minutes Pulse- Standing at 0 minutes  08/10/18 1351 113/76 54 132/84 60 115/87 56    She describes some mild intermittent epigastric abdominal pain. Will obtain abdominal US to further assess.   [place] does not show any acute intraabdominal process but shows a right pleural effusion. She had a recent CXR in September which did not comment on this. She was encouraged to f/u with her PCP.  Final Clinical Impressions(s) / ED Diagnoses   Final diagnoses:  Other fatigue  Near syncope  Pleural effusion on right    ED Discharge Orders    None       Bethel Born, PA-C 08/10/18 1653    Raeford Razor, MD 08/12/18 [phone]

## 2018-08-10 NOTE — ED Triage Notes (Signed)
Patient c/o having near syncope and weakness x 4 days ago and today. Patient states she went to her PCP 3 days ago and was going to order her a heart monitor and then today she was told to come to the ED for further evaluation.

## 2018-08-19 ENCOUNTER — Other Ambulatory Visit: Payer: Self-pay

## 2018-08-19 ENCOUNTER — Emergency Department (HOSPITAL_COMMUNITY): Payer: 59

## 2018-08-19 ENCOUNTER — Encounter (HOSPITAL_COMMUNITY): Payer: Self-pay

## 2018-08-19 ENCOUNTER — Emergency Department (HOSPITAL_COMMUNITY)
Admission: EM | Admit: 2018-08-19 | Discharge: 2018-08-19 | Disposition: A | Discharge status: Home / Self Care | Payer: 59 | Attending: Emergency Medicine | Admitting: Emergency Medicine

## 2018-08-19 DIAGNOSIS — R200 Anesthesia of skin: Secondary | ICD-10-CM

## 2018-08-19 DIAGNOSIS — Z79899 Other long term (current) drug therapy: Secondary | ICD-10-CM | POA: Insufficient documentation

## 2018-08-19 DIAGNOSIS — R202 Paresthesia of skin: Secondary | ICD-10-CM | POA: Insufficient documentation

## 2018-08-19 DIAGNOSIS — R55 Syncope and collapse: Secondary | ICD-10-CM | POA: Diagnosis present

## 2018-08-19 DIAGNOSIS — R079 Chest pain, unspecified: Secondary | ICD-10-CM

## 2018-08-19 DIAGNOSIS — I10 Essential (primary) hypertension: Secondary | ICD-10-CM | POA: Diagnosis not present

## 2018-08-19 LAB — CBC WITH DIFFERENTIAL/PLATELET
Abs Immature Granulocytes: 0.03 10*3/uL (ref 0.00–0.07)
Basophils Absolute: 0 10*3/uL (ref 0.0–0.1)
Basophils Relative: 1 %
Eosinophils Absolute: 0 10*3/uL (ref 0.0–0.5)
Eosinophils Relative: 0 %
HEMATOCRIT: 44.1 % (ref 36.0–46.0)
Hemoglobin: 14 g/dL (ref 12.0–15.0)
Immature Granulocytes: 0 %
Lymphocytes Relative: 26 %
Lymphs Abs: 2.3 10*3/uL (ref 0.7–4.0)
MCH: 30.4 pg (ref 26.0–34.0)
MCHC: 31.7 g/dL (ref 30.0–36.0)
MCV: 95.7 fL (ref 80.0–100.0)
Monocytes Absolute: 0.5 10*3/uL (ref 0.1–1.0)
Monocytes Relative: 6 %
Neutro Abs: 6 10*3/uL (ref 1.7–7.7)
Neutrophils Relative %: 67 %
Platelets: 270 10*3/uL (ref 150–400)
RBC: 4.61 MIL/uL (ref 3.87–5.11)
RDW: 12.4 % (ref 11.5–15.5)
WBC: 8.9 10*3/uL (ref 4.0–10.5)
nRBC: 0 % (ref 0.0–0.2)

## 2018-08-19 LAB — BASIC METABOLIC PANEL
Anion gap: 10 (ref 5–15)
BUN: 9 mg/dL (ref 6–20)
CO2: 29 mmol/L (ref 22–32)
CREATININE: 0.84 mg/dL (ref 0.44–1.00)
Calcium: 9.8 mg/dL (ref 8.9–10.3)
Chloride: 104 mmol/L (ref 98–111)
GFR calc Af Amer: >60 mL/min (ref >60)
GFR calc non Af Amer: >60 mL/min (ref >60)
Glucose, Bld: 97 mg/dL (ref 70–99)
Potassium: 3.4 mmol/L — ABNORMAL LOW (ref 3.5–5.1)
Sodium: 143 mmol/L (ref 135–145)

## 2018-08-19 LAB — TROPONIN I
Troponin I: <0.03 ng/mL (ref <0.03)
Troponin I: <0.03 ng/mL (ref <0.03)

## 2018-08-19 MED ORDER — ASPIRIN EC 81 MG PO TBEC: 81.0000 mg | DELAYED_RELEASE_TABLET | Freq: Every day | ORAL | 0 refills | Status: AC

## 2018-08-19 MED ORDER — LORAZEPAM 2 MG/ML IJ SOLN
1.0000 mg | Freq: Once | INTRAMUSCULAR | Status: AC
  Administered 2018-08-19: 1 mg via INTRAVENOUS
  Filled 2018-08-19: qty 1

## 2018-08-19 MED ORDER — LORAZEPAM 2 MG/ML IJ SOLN
1.0000 mg | Freq: Once | INTRAMUSCULAR | Status: AC
  Administered 2018-08-19: 1 mg via INTRAMUSCULAR
  Filled 2018-08-19: qty 1

## 2018-08-19 NOTE — Discharge Instructions (Addendum)
Please read and follow all provided instructions.  Your diagnoses today include:  1. Left sided numbness   2. Left-sided chest pain     Tests performed today include: An EKG of your heart A chest x-ray Cardiac enzymes - a blood test for heart muscle damage Blood counts and electrolytes Vital signs. See below for your results today.  MRI of brain-normal  Medications prescribed:   Take any prescribed medications only as directed.  Please start taking 81 mg of aspirin.   Follow-up instructions: Please follow-up with your primary care provider as soon as you can for further evaluation of your symptoms.  Please continue to follow-up with your primary care provider regarding the upcoming cardiology appointment.  Please also follow his schedule appointment with your neurologist, Dr. Marjory LiesPenumalli.  Return instructions:  SEEK IMMEDIATE MEDICAL ATTENTION IF: You have severe chest pain, especially if the pain is crushing or pressure-like and spreads to the arms, back, neck, or jaw, or if you have sweating, nausea (feeling sick to your stomach), or shortness of breath. THIS IS AN EMERGENCY. Don't wait to see if the pain will go away. Get medical help at once. Call 911 or 0 (operator). DO NOT drive yourself to the hospital.  Your chest pain gets worse and does not go away with rest.  You have an attack of chest pain lasting longer than usual, despite rest and treatment with the medications your caregiver has prescribed.  You wake from sleep with chest pain or shortness of breath. You feel dizzy or faint. You have chest pain not typical of your usual pain for which you originally saw your caregiver.  You have any other emergent concerns regarding your health.  Additional Information: Chest pain comes from many different causes. Your caregiver has diagnosed you as having chest pain that is not specific for one problem, but does not require admission.  You are at low risk for an acute heart  condition or other serious illness.   Your vital signs today were: BP 109/77    Pulse (!) 59    Temp 98.1 F (36.7 C) (Oral)    Resp 16    Ht 5' (1.524 m)    Wt 81.6 kg    SpO2 98%    BMI 35.15 kg/m  If your blood pressure (BP) was elevated above 135/85 this visit, please have this repeated by your doctor within one month. --------------

## 2018-08-19 NOTE — ED Notes (Signed)
This RN witnessed the patient getting an uber as a ride home.

## 2018-08-19 NOTE — ED Notes (Signed)
Patient transported to X-ray 

## 2018-08-19 NOTE — ED Notes (Signed)
Patient transported to MRI 

## 2018-08-19 NOTE — ED Triage Notes (Signed)
Pt arrives via POV from home. Pt reports that yesterday morning she woke with left arm and left hand numbness. Pt reports that through out the day she began having left foot numbness. Pt reports the numbness in her arm and hand has improved some she began having sharp pain in her chest this am. Pt denies chest pain at this time.

## 2018-08-19 NOTE — ED Provider Notes (Signed)
[facility]-EMERGENCY DEPT Provider Note   CSN: [id number] Arrival date & time: 08/19/18  1230     History   Chief Complaint Chief Complaint  Patient presents with  . Numbness  . Chest Pain    HPI Caitlyn Wilson is a 52 y.o. female.  HPI  Patient is a 52 year old female with a history of empty sella syndrome, headaches, hypertension, hypercholesterolemia, vertigo, and recurrent episodes of near syncope presenting for numbness on the left side and chest pain.  Patient reports her symptoms began when she woke up yesterday around 7 AM.  She reports that she does not know when her exact last known normal was that she woke up this way.  Patient reports that it began with numbness of the entire left upper extremity and her fingers, but improved throughout the day to just be her lateral right hand including digits 4 and 5.  She reports that she then began having some numbness and tingling in her lateral left foot including digits 4 and 5 of the left lower extremity.  Patient denies any muscular weakness.  Denies any visual disturbance, speech disturbance, abnormal gait, vertigo.  She reports she had a mild headache today that has now resolved.  Patient also where she had fleeting episodes of left-sided sharp chest pain that radiated into her left axilla.  She reports it only lasted a couple minutes at a time.  Patient denies any syncope, presyncope, or shortness of breath during the past 24 hours.  Patient is a never smoker.  Patient denies any family history of early MI.  Patient denies any history of MI or CVA.    Past Medical History:  Diagnosis Date  . Empty sella syndrome (HCC)   . GERD (gastroesophageal reflux disease)   . Headache(784.0)    migraines many yrs ago  . Heart murmur   . Hypercholesteremia   . Hypertension   . Vertigo     Patient Active Problem List   Diagnosis Date Noted  . Diarrhea 01/30/2013    Past Surgical History:  Procedure Laterality  Date  . BREAST BIOPSY Left 10/07/2006   benign  . COLONOSCOPY WITH PROPOFOL N/A 01/11/2014   Procedure: COLONOSCOPY WITH PROPOFOL;  Surgeon: Charolett Bumpers, MD;  Location: [facility];  Service: Endoscopy;  Laterality: N/A;  . TONSILLECTOMY       OB History   None      Home Medications    Prior to Admission medications   Medication Sig Start Date End Date Taking? Authorizing Provider  ALPRAZolam Prudy Feeler) 0.5 MG tablet Take 0.25-0.5 mg by mouth daily as needed for anxiety. 08/14/18  Yes [provider]  atenolol (TENORMIN) 50 MG tablet Take 12.5 mg by mouth at bedtime.  07/10/18  Yes [provider]  hydrochlorothiazide (HYDRODIURIL) 25 MG tablet Take 12.5 mg by mouth at bedtime.  12/02/14  Yes [provider]  zolpidem (AMBIEN) 5 MG tablet Take 2.5 mg by mouth at bedtime as needed for sleep.  07/24/18  Yes [provider]    Family History History reviewed. No pertinent family history.  Social History Social History   Tobacco Use  . Smoking status: Never Smoker  . Smokeless tobacco: Never Used  Substance Use Topics  . Alcohol use: Yes    Comment: occasionally   . Drug use: No     Allergies   Amoxicillin and Penicillins   Review of Systems Review of Systems  Constitutional: Negative for chills and fever.  HENT: Negative for congestion, rhinorrhea, sinus pain and sore throat.   Eyes: Negative for visual disturbance.  Respiratory: Negative for cough, chest tightness and shortness of breath.   Cardiovascular: Positive for chest pain. Negative for palpitations and leg swelling.  Gastrointestinal: Negative for abdominal pain, constipation, diarrhea, nausea and vomiting.  Genitourinary: Negative for dysuria and flank pain.  Musculoskeletal: Negative for back pain and myalgias.  Skin: Negative for rash.  Neurological: Positive for light-headedness and numbness. Negative for dizziness, syncope, weakness and headaches.     Physical  Exam Updated Vital Signs BP 136/88   Pulse (!) 54   Temp 98.1 F (36.7 C) (Oral)   Resp 16   Ht 5' (1.524 m)   Wt 81.6 kg   SpO2 98%   BMI 35.15 kg/m   Physical Exam  Constitutional: She appears well-developed and well-nourished. No distress.  HENT:  Head: Normocephalic and atraumatic.  Mouth/Throat: Oropharynx is clear and moist.  Eyes: Pupils are equal, round, and reactive to light. Conjunctivae and EOM are normal.  Neck: Normal range of motion. Neck supple.  Cardiovascular: Normal rate, regular rhythm, S1 normal and S2 normal.  No murmur heard. Pulses:      Radial pulses are 2+ on the right side, and 2+ on the left side.       Dorsalis pedis pulses are 2+ on the right side, and 2+ on the left side.  Pulmonary/Chest: Effort normal and breath sounds normal. She has no wheezes. She has no rales.  Abdominal: Soft. She exhibits no distension. There is no tenderness. There is no guarding.  Musculoskeletal: Normal range of motion. She exhibits no edema or deformity.  Lymphadenopathy:    She has no cervical adenopathy.  Neurological: She is alert.  Mental Status:  Alert, oriented, thought content appropriate, able to give a coherent history. Speech fluent without evidence of aphasia. Able to follow 2 step commands without difficulty.  Cranial Nerves:  II:  Peripheral visual fields grossly normal, pupils equal, round, reactive to light III,IV, VI: ptosis not present, extra-ocular motions intact bilaterally  V,VII: smile symmetric, facial light touch sensation equal VIII: hearing grossly normal to voice  X: uvula elevates symmetrically  XI: bilateral shoulder shrug symmetric and strong XII: midline tongue extension without fassiculations Motor:  Normal tone. 5/5 in upper and lower extremities bilaterally including strong and equal grip strength and dorsiflexion/plantar flexion Sensory: Reported decrease sensation in the fourth and fifth digits of the left hand as well as fourth of  the digits of the left foot. Deep Tendon Reflexes: 2+ and symmetric in the biceps and patella. No clonus. Cerebellar: normal finger-to-nose with bilateral upper extremities Gait: normal gait and balance Stance: Romberg negative. No pronator drift and good coordination, strength, and position sense with tapping of bilateral arms (performed in sitting position). CV: distal pulses palpable throughout    Skin: Skin is warm and dry. No rash noted. No erythema.  Psychiatric: She has a normal mood and affect. Her behavior is normal. Judgment and thought content normal.  Nursing note and vitals reviewed.    ED Treatments / Results  Labs (all labs ordered are listed, but only abnormal results are displayed) Labs Reviewed  BASIC METABOLIC PANEL - Abnormal; Notable for the following components:      Result Value   Potassium 3.4 (*)    All other components within normal limits  TROPONIN I  CBC WITH DIFFERENTIAL/PLATELET  TROPONIN I    EKG EKG Interpretation  Date/Time:  Wednesday August 19 2018 12:59:00 EST Ventricular Rate:  58 PR Interval:    QRS Duration: 85 QT Interval:  394 QTC Calculation: 387 R Axis:   34 Text Interpretation:  Sinus rhythm ST elev, probable normal early repol pattern Confirmed by Geoffery Lyons ([id number]) on 08/19/2018 1:04:20 PM   Radiology Dg Chest 2 View  Result Date: 08/19/2018 CLINICAL DATA:  Chest pain. EXAM: CHEST - 2 VIEW COMPARISON:  05/28/2018 FINDINGS: The heart size and mediastinal contours are within normal limits. Both lungs are clear. The visualized skeletal structures are unremarkable. IMPRESSION: No active cardiopulmonary disease. Electronically Signed   By: Signa Kell M.D.   On: 08/19/2018 14:59   Mr Brain Wo Contrast  Result Date: 08/19/2018 CLINICAL DATA:  TIA EXAM: MRI HEAD WITHOUT CONTRAST TECHNIQUE: Multiplanar, multiecho pulse sequences of the brain and surrounding structures were obtained without intravenous contrast. COMPARISON:   Brain MRI 06/01/2018 FINDINGS: BRAIN: There is no acute infarct, acute hemorrhage, hydrocephalus or extra-axial collection. Incidentally noted and unchanged partially empty sella. There are no old infarcts. The white matter signal is normal for the patient's age. The cerebral and cerebellar volume are age-appropriate. Susceptibility-sensitive sequences show no chronic microhemorrhage or superficial siderosis. VASCULAR: Major intracranial arterial and venous sinus flow voids are normal. SKULL AND UPPER CERVICAL SPINE: Calvarial bone marrow signal is normal. There is no skull base mass. Visualized upper cervical spine and soft tissues are normal. SINUSES/ORBITS: No fluid levels or advanced mucosal thickening. No mastoid or middle ear effusion. The orbits are normal. IMPRESSION: Normal MRI of the brain. Electronically Signed   By: Deatra Robinson M.D.   On: 08/19/2018 18:40    Procedures Procedures (including critical care time)  Medications Ordered in ED Medications  LORazepam (ATIVAN) injection 1 mg (has no administration in time range)  LORazepam (ATIVAN) injection 1 mg (1 mg Intramuscular Given 08/19/18 1653)     Initial Impression / Assessment and Plan / ED Course  I have reviewed the triage vital signs and the nursing notes.  Pertinent labs & imaging results that were available during my care of the patient were reviewed by me and considered in my medical decision making (see chart for details).  Clinical Course as of Aug 19 2024  Wed Aug 19, 2018  1705 Reassessed. Updated on plan of care.    [AM]  2025 Patient states she will take an [place] home since she was given Ativan for her MRI.   [AM]    Clinical Course User Index [AM] Delia Chimes    This is a 52 year old female history of hypertension, hyperlipidemia, and obesity presenting for left-sided numbness and episodes of fleeting chest pain today.  Numbness is greater than 24 hours and symptoms.  After diagnosis includes TIA,  CVA, comp gated migraine, ACS, pulmonary embolism, pericarditis, myocarditis, pneumonia, pleurisy.   Case was discussed with Dr. Otelia Limes of neurology who stated that MRI without contrast alone is a single exam is sufficient.  States that given that symptoms are greater than 24 hours old, MRI should demonstrate any kind of acute area of infarction.  I appreciate his involvement in the care of this patient.  Doubt ACS given the fleeting nature symptoms, atypical story nonexertional.  Patient is a heart score of 3 for age and risk factors.  Doubt pulmonary embolism given that patient is having no ongoing chest pain or shortness of breath.  Patient cannot be fully cleared per [facility] rules, given age, however the pretest probability for pulmonary embolism is very  low.  As discussed with Dr. Otelia LimesLindzen, symptoms may be peripheral.   Patient's MRI is unchanged from September 2019.  Patient has establish care with a neurologist, Dr. Marjory LiesPenumalli.  She is also in the process of obtaining a cardiologist.  She has worn a Holter monitor over the past couple weeks prescribed by her primary care provider.  Patient has optimal follow-up, low heart score, and low ABCD 2 score.  At this time do not see acute reason for hospitalization.  Will start patient on 81 mg of aspirin daily.  This is a supervised visit with Dr. Emily Filbertoug Delo and Dr. Shaune Pollackameron Isaacs. Evaluation, management, and discharge planning discussed with this attending physician.  Final Clinical Impressions(s) / ED Diagnoses   Final diagnoses:  Left sided numbness  Left-sided chest pain    ED Discharge Orders         Ordered    aspirin EC 81 MG tablet  Daily     08/19/18 2023           Delia ChimesMurray, Abigail Teall B, PA-C 08/19/18 2025    Geoffery Lyonselo, Douglas, MD 08/20/18 1521

## 2018-11-29 ENCOUNTER — Encounter (HOSPITAL_COMMUNITY): Payer: Self-pay | Admitting: Obstetrics and Gynecology

## 2018-11-29 ENCOUNTER — Other Ambulatory Visit: Payer: Self-pay

## 2018-11-29 ENCOUNTER — Emergency Department (HOSPITAL_COMMUNITY)
Admission: EM | Admit: 2018-11-29 | Discharge: 2018-11-29 | Disposition: A | Discharge status: Home / Self Care | Payer: 59 | Attending: Emergency Medicine | Admitting: Emergency Medicine

## 2018-11-29 DIAGNOSIS — Z79899 Other long term (current) drug therapy: Secondary | ICD-10-CM | POA: Diagnosis not present

## 2018-11-29 DIAGNOSIS — Z7982 Long term (current) use of aspirin: Secondary | ICD-10-CM | POA: Diagnosis not present

## 2018-11-29 DIAGNOSIS — M79642 Pain in left hand: Secondary | ICD-10-CM | POA: Diagnosis not present

## 2018-11-29 DIAGNOSIS — I10 Essential (primary) hypertension: Secondary | ICD-10-CM | POA: Insufficient documentation

## 2018-11-29 DIAGNOSIS — R51 Headache: Secondary | ICD-10-CM | POA: Insufficient documentation

## 2018-11-29 DIAGNOSIS — M79605 Pain in left leg: Secondary | ICD-10-CM

## 2018-11-29 DIAGNOSIS — R519 Headache, unspecified: Secondary | ICD-10-CM

## 2018-11-29 DIAGNOSIS — R200 Anesthesia of skin: Secondary | ICD-10-CM | POA: Insufficient documentation

## 2018-11-29 NOTE — Discharge Instructions (Addendum)
We saw in the ER for left leg pain and left hand discomfort.  It also appears that you have had some headaches and dizziness. It is unclear as to what is the underlying process at this time.  I do not think that you have a blood clot nor do we think this was a stroke like event.  At this time we recommend that you follow-up with your primary care doctor so that there kept in the loop of your symptoms.  The episodes where you might be blanking out could be because of seizure-like activity, so discussed with your primary care doctor if they feel you need to see neurology for further evaluation.  Return to the ER immediately if you start having constant numbness, weakness on one side, slurring of your speech, vision changes.

## 2018-11-29 NOTE — ED Triage Notes (Signed)
Patient reports she has been having leg pain for about an hour. Pt reports she was just watching TV and it started hurting.PT states it then started to hurt in her arm as well. Pt reports it is her left arm and left leg. Pt denies hx of sciatica.

## 2018-11-29 NOTE — ED Provider Notes (Signed)
[facility]-EMERGENCY DEPT Provider Note   CSN: [id number] Arrival date & time: 11/29/18  2235    History   Chief Complaint Chief Complaint  Patient presents with  . Leg Pain  . Arm Pain    HPI Caitlyn Wilson is a 53 y.o. female.     HPI 53 year old female comes in with chief complaint of leg pain and arm pain. She reports that this evening she started having some headaches while she was with her family.  Patient decided to go home and once home she started having dizziness, similar to her vertigo that she has had in the past.  Patient decided to lay down and relax when she started having sharp discomfort in her left lower extremity on the lateral side.  Soon after she started having pain in her foot along with tingling followed by pain in her left hand.  All of her pain is now resolved and the episode of pain lasted just for a few minutes.  Patient called the nursing helpline and she was advised to come to the ER.  Pt has no hx of PE, DVT and denies any exogenous hormone (testosterone / estrogen) use, long distance travels or surgery in the past 6 weeks, active cancer, recent immobilization.  Patient states that she has seen neurologist and cardiologist for her dizziness and episodes of passing out.  Her cardiology work-up revealed few events of SVT but a normal echocardiogram.  Her neurologic work-up has included MRI of the brain x2 and they have been negative.  The neurology team diagnosed her with diagnosis of vertigo along with complex migraine.  She describes her episodes of near fainting as " typically I am reading something or looking at my phone and I stop focusing, and it takes me a few minutes to get my bearings".  She had similar episodes at work as well.  Past Medical History:  Diagnosis Date  . Empty sella syndrome (HCC)   . GERD (gastroesophageal reflux disease)   . Headache(784.0)    migraines many yrs ago  . Heart murmur   .  Hypercholesteremia   . Hypertension   . Vertigo     Patient Active Problem List   Diagnosis Date Noted  . Diarrhea 01/30/2013    Past Surgical History:  Procedure Laterality Date  . BREAST BIOPSY Left 10/07/2006   benign  . COLONOSCOPY WITH PROPOFOL N/A 01/11/2014   Procedure: COLONOSCOPY WITH PROPOFOL;  Surgeon: Charolett Bumpers, MD;  Location: [facility];  Service: Endoscopy;  Laterality: N/A;  . TONSILLECTOMY       OB History   No obstetric history on file.      Home Medications    Prior to Admission medications   Medication Sig Start Date End Date Taking? Authorizing Provider  ALPRAZolam Prudy Feeler) 0.5 MG tablet Take 0.25-0.5 mg by mouth daily as needed for anxiety. 08/14/18   [provider]  aspirin EC 81 MG tablet Take 1 tablet (81 mg total) by mouth daily. 08/19/18   Aviva Kluver B, PA-C  atenolol (TENORMIN) 50 MG tablet Take 12.5 mg by mouth at bedtime.  07/10/18   [provider]  hydrochlorothiazide (HYDRODIURIL) 25 MG tablet Take 12.5 mg by mouth at bedtime.  12/02/14   [provider]  zolpidem (AMBIEN) 5 MG tablet Take 2.5 mg by mouth at bedtime as needed for sleep.  07/24/18   [provider]    Family History No family history on file.  Social History  Social History   Tobacco Use  . Smoking status: Never Smoker  . Smokeless tobacco: Never Used  Substance Use Topics  . Alcohol use: Yes    Comment: occasionally   . Drug use: No     Allergies   Amoxicillin and Penicillins   Review of Systems Review of Systems  All other systems reviewed and are negative.    Physical Exam Updated Vital Signs BP (!) 126/91 (BP Location: Left Arm)   Pulse 75   Temp 98.2 F (36.8 C) (Oral)   Resp 17   Ht 5' (1.524 m)   Wt 79.4 kg   SpO2 99%   BMI 34.18 kg/m   Physical Exam Vitals signs and nursing note reviewed.  Constitutional:      Appearance: She is well-developed.  HENT:     Head: Normocephalic and atraumatic.   Eyes:     Extraocular Movements: Extraocular movements intact.  Neck:     Musculoskeletal: Normal range of motion and neck supple.  Cardiovascular:     Rate and Rhythm: Normal rate.  Pulmonary:     Effort: Pulmonary effort is normal.  Abdominal:     General: Bowel sounds are normal.  Musculoskeletal:        General: No swelling or tenderness.     Right lower leg: No edema.     Left lower leg: No edema.  Skin:    General: Skin is warm and dry.  Neurological:     General: No focal deficit present.     Mental Status: She is alert and oriented to person, place, and time.      ED Treatments / Results  Labs (all labs ordered are listed, but only abnormal results are displayed) Labs Reviewed - No data to display  EKG None  Radiology No results found.  Procedures Procedures (including critical care time)  Medications Ordered in ED Medications - No data to display   Initial Impression / Assessment and Plan / ED Course  I have reviewed the triage vital signs and the nursing notes.  Pertinent labs & imaging results that were available during my care of the patient were reviewed by me and considered in my medical decision making (see chart for details).        53 year old female comes in a chief complaint of left hand discomfort and left leg pain.  Her symptoms of pain have now resolved.  She had some foot numbness as well.  There is no clinical concerns of DVT based on physical exam findings and also lack of risk factors on history.  Patient social history is completely normal, there is no signs of infection in the leg nor is there any history of trauma.  As far as her left upper extremity involvement, I am unsure what to make of her left-sided symptoms.  It does not appear that this was a stroke/TIA type event.  Her cardiac work-up and neurology work-up as an outpatient has been reviewed.  It seems like she was diagnosed with SVT but otherwise her cardiac exam is normal  and her neurologic work-up revealed no abnormalities on MRI x2.  I wondered if her events of passing out are consistent with syncope because of SVT or an absent type seizure.  It is pretty clear that there is no generalized seizure-like activity.  For now I am recommending that the patient follow-up with her PCP.  She might need further neurologic evaluation or an EEG if there is concerns for absence seizure type  episodes.  More importantly I think her symptoms are evolving it is best that PCP is kept in the loop.  Patient is understanding of our request.  She is comfortable with the plan.  Stable for discharge at this time.  Strict ER return precautions discussed.  Final Clinical Impressions(s) / ED Diagnoses   Final diagnoses:  Left leg pain  Nonintractable headache, unspecified chronicity pattern, unspecified headache type  Pain of left hand    ED Discharge Orders    None       Derwood Kaplan, MD 11/29/18 2354

## 2018-12-05 ENCOUNTER — Emergency Department (HOSPITAL_COMMUNITY)
Admission: EM | Admit: 2018-12-05 | Discharge: 2018-12-06 | Disposition: A | Discharge status: Home / Self Care | Payer: 59 | Attending: Emergency Medicine | Admitting: Emergency Medicine

## 2018-12-05 ENCOUNTER — Encounter (HOSPITAL_COMMUNITY): Payer: Self-pay | Admitting: Emergency Medicine

## 2018-12-05 ENCOUNTER — Other Ambulatory Visit: Payer: Self-pay

## 2018-12-05 DIAGNOSIS — I10 Essential (primary) hypertension: Secondary | ICD-10-CM | POA: Diagnosis not present

## 2018-12-05 DIAGNOSIS — Z79899 Other long term (current) drug therapy: Secondary | ICD-10-CM | POA: Diagnosis not present

## 2018-12-05 DIAGNOSIS — R51 Headache: Secondary | ICD-10-CM | POA: Insufficient documentation

## 2018-12-05 DIAGNOSIS — R519 Headache, unspecified: Secondary | ICD-10-CM

## 2018-12-05 DIAGNOSIS — Z7982 Long term (current) use of aspirin: Secondary | ICD-10-CM | POA: Insufficient documentation

## 2018-12-05 DIAGNOSIS — R200 Anesthesia of skin: Secondary | ICD-10-CM | POA: Insufficient documentation

## 2018-12-05 NOTE — ED Triage Notes (Addendum)
Patient complaining of headache and numbness in left arm that started 41 minutes ago. Patient is not complaining of no other symptoms. Patient has a dx of Empty sella syndrome.

## 2018-12-05 NOTE — ED Provider Notes (Signed)
[facility]-EMERGENCY DEPT Provider Note   CSN: [id number] Arrival date & time: 12/05/18  2259    History   Chief Complaint Chief Complaint  Patient presents with  . Headache  . Numbness    HPI Caitlyn Wilson is a 53 y.o. female.     Patient is a 53 year old female with history of hypertension, migraines, GERD.  She presents today for evaluation of right sided headache and left arm numbness.  This began approximately 1 hour ago.  She was watching TV at the time.  She describes a sharp pain in her head that comes and goes, but a constant numbness to her entire left hand.  She denies any weakness.  She denies any visual disturbances.  This headache began in the absence of any injury or trauma.  Patient has had similar episodes in the past.  She has had an MRI within the past 6 months that showed no acute process.  The history is provided by the patient.  Headache  Pain location:  R parietal Quality:  Stabbing Radiates to:  Does not radiate Timing:  Intermittent Chronicity:  New Relieved by:  Nothing   Past Medical History:  Diagnosis Date  . Empty sella syndrome (HCC)   . GERD (gastroesophageal reflux disease)   . Headache(784.0)    migraines many yrs ago  . Heart murmur   . Hypercholesteremia   . Hypertension   . Vertigo     Patient Active Problem List   Diagnosis Date Noted  . Diarrhea 01/30/2013    Past Surgical History:  Procedure Laterality Date  . BREAST BIOPSY Left 10/07/2006   benign  . COLONOSCOPY WITH PROPOFOL N/A 01/11/2014   Procedure: COLONOSCOPY WITH PROPOFOL;  Surgeon: Charolett Bumpers, MD;  Location: [facility];  Service: Endoscopy;  Laterality: N/A;  . TONSILLECTOMY       OB History   No obstetric history on file.      Home Medications    Prior to Admission medications   Medication Sig Start Date End Date Taking? Authorizing Provider  ALPRAZolam Prudy Feeler) 0.5 MG tablet Take 0.25-0.5 mg by mouth daily as needed for  anxiety. 08/14/18   [provider]  aspirin EC 81 MG tablet Take 1 tablet (81 mg total) by mouth daily. 08/19/18   Aviva Kluver B, PA-C  atenolol (TENORMIN) 50 MG tablet Take 12.5 mg by mouth at bedtime.  07/10/18   [provider]  hydrochlorothiazide (HYDRODIURIL) 25 MG tablet Take 12.5 mg by mouth at bedtime.  12/02/14   [provider]  zolpidem (AMBIEN) 5 MG tablet Take 2.5 mg by mouth at bedtime as needed for sleep.  07/24/18   [provider]    Family History History reviewed. No pertinent family history.  Social History Social History   Tobacco Use  . Smoking status: Never Smoker  . Smokeless tobacco: Never Used  Substance Use Topics  . Alcohol use: Yes    Comment: occasionally   . Drug use: No     Allergies   Amoxicillin and Penicillins   Review of Systems Review of Systems  Neurological: Positive for headaches.  All other systems reviewed and are negative.    Physical Exam Updated Vital Signs BP 133/78 (BP Location: Left Arm)   Pulse 72   Temp 98.8 F (37.1 C) (Oral)   Resp 18   Ht 5' (1.524 m)   Wt 77.1 kg   SpO2 99%   BMI 33.20 kg/m   Physical  Exam Vitals signs and nursing note reviewed.  Constitutional:      General: She is not in acute distress.    Appearance: She is well-developed. She is not diaphoretic.  HENT:     Head: Normocephalic and atraumatic.  Eyes:     Extraocular Movements: Extraocular movements intact.     Pupils: Pupils are equal, round, and reactive to light.  Neck:     Musculoskeletal: Normal range of motion and neck supple.  Cardiovascular:     Rate and Rhythm: Normal rate and regular rhythm.     Heart sounds: No murmur. No friction rub. No gallop.   Pulmonary:     Effort: Pulmonary effort is normal. No respiratory distress.     Breath sounds: Normal breath sounds. No wheezing.  Abdominal:     General: Bowel sounds are normal. There is no distension.     Palpations: Abdomen is soft.      Tenderness: There is no abdominal tenderness.  Musculoskeletal: Normal range of motion.  Skin:    General: Skin is warm and dry.  Neurological:     Mental Status: She is alert and oriented to person, place, and time.     Cranial Nerves: No cranial nerve deficit, dysarthria or facial asymmetry.     Motor: No weakness.     Coordination: Coordination normal.      ED Treatments / Results  Labs (all labs ordered are listed, but only abnormal results are displayed) Labs Reviewed - No data to display  EKG None  Radiology No results found.  Procedures Procedures (including critical care time)  Medications Ordered in ED Medications - No data to display   Initial Impression / Assessment and Plan / ED Course  I have reviewed the triage vital signs and the nursing notes.  Pertinent labs & imaging results that were available during my care of the patient were reviewed by me and considered in my medical decision making (see chart for details).  Patient presenting here with complaints of headache and left arm numbness.  This started just prior to arrival.  She has a history of similar episodes in the past, with these episodes being unexplained.  She denies any visual disturbances.  She is neurologically intact and head CT is negative.  I suspect possibly a complex migraine, and see nothing on exam or work-up that appears emergent.  Patient will be discharged and is to follow-up as needed if symptoms worsen.  Final Clinical Impressions(s) / ED Diagnoses   Final diagnoses:  None    ED Discharge Orders    None       Geoffery Lyons, MD 12/06/18 [phone]

## 2018-12-06 ENCOUNTER — Emergency Department (HOSPITAL_COMMUNITY): Payer: 59

## 2018-12-06 NOTE — Discharge Instructions (Addendum)
Continue medications as previously prescribed and follow-up with your primary doctor if your symptoms or not improving in the next few days.

## 2018-12-23 ENCOUNTER — Encounter (HOSPITAL_COMMUNITY): Payer: Self-pay | Admitting: Emergency Medicine

## 2018-12-23 ENCOUNTER — Other Ambulatory Visit: Payer: Self-pay

## 2018-12-23 ENCOUNTER — Emergency Department (HOSPITAL_COMMUNITY)
Admission: EM | Admit: 2018-12-23 | Discharge: 2018-12-23 | Disposition: A | Discharge status: Home / Self Care | Payer: 59 | Attending: Emergency Medicine | Admitting: Emergency Medicine

## 2018-12-23 DIAGNOSIS — Z79899 Other long term (current) drug therapy: Secondary | ICD-10-CM | POA: Diagnosis not present

## 2018-12-23 DIAGNOSIS — Z7982 Long term (current) use of aspirin: Secondary | ICD-10-CM | POA: Insufficient documentation

## 2018-12-23 DIAGNOSIS — I10 Essential (primary) hypertension: Secondary | ICD-10-CM | POA: Diagnosis not present

## 2018-12-23 DIAGNOSIS — R51 Headache: Secondary | ICD-10-CM | POA: Insufficient documentation

## 2018-12-23 DIAGNOSIS — R519 Headache, unspecified: Secondary | ICD-10-CM

## 2018-12-23 NOTE — ED Provider Notes (Signed)
[facility]-EMERGENCY DEPT Provider Note   CSN: [id number] Arrival date & time: 12/23/18  1549    History   Chief Complaint Chief Complaint  Patient presents with  . Eye Pain  . Hypertension  . Generalized Body Aches    HPI Caitlyn Wilson is a 53 y.o. female.     HPI   Patient presents here for evaluation of headaches and high blood pressure.  She has had numerous evaluations by PCP as well as neurology in the last several months for variations of neurologic complaints. Today, she became concerned when she developed a right-sided headache and pain in her right eye associated with a numb sensation in her right foot.  All of the symptoms have waxed and waned, including times when she had no discomfort, and at other times has mild discomfort.  She does not describe the symptoms as "severe."  She decided to contact her nurse line and her PCP, who ultimately recommended that she come here for evaluation.  She has high blood pressure, and is taking her medicine as prescribed.  She relates having significant stress at work because of the type of job she has which is stress inducing.  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  She is contemplating ending jobs because she has been doing it for a long time.  She states she sometimes sees a therapist.  She admits to having stress related to work.  Her ongoing neurologic symptoms have included bilateral numbness, bilateral headaches, occasional eye complaints, and vertigo.  She has had 2 negative brain MRIs and a negative CT head in the last 6 months.  She denies fever, chills, cough, shortness of breath, sore throat, ear pain, change in urine symptoms.  She noticed some diarrhea today after eating kale and peas, yesterday.  She denies blood in stool.  There are no other known modifying factors.  Past Medical History:  Diagnosis Date  . Empty sella syndrome (HCC)   . GERD  (gastroesophageal reflux disease)   . Headache(784.0)    migraines many yrs ago  . Heart murmur   . Hypercholesteremia   . Hypertension   . Vertigo     Patient Active Problem List   Diagnosis Date Noted  . Diarrhea 01/30/2013    Past Surgical History:  Procedure Laterality Date  . BREAST BIOPSY Left 10/07/2006   benign  . COLONOSCOPY WITH PROPOFOL N/A 01/11/2014   Procedure: COLONOSCOPY WITH PROPOFOL;  Surgeon: Charolett BumpersMartin K Johnson, MD;  Location: [facility];  Service: Endoscopy;  Laterality: N/A;  . TONSILLECTOMY       OB History   No obstetric history on file.      Home Medications    Prior to Admission medications   Medication Sig Start Date End Date Taking? Authorizing Provider  ALPRAZolam Prudy Feeler(XANAX) 0.5 MG tablet Take 0.25-0.5 mg by mouth daily as needed for anxiety. 08/14/18   [provider]  aspirin EC 81 MG tablet Take 1 tablet (81 mg total) by mouth daily. 08/19/18   Aviva KluverMurray, Alyssa B, PA-C  atenolol (TENORMIN) 50 MG tablet Take 12.5 mg by mouth at bedtime.  07/10/18   [provider]  hydrochlorothiazide (HYDRODIURIL) 25 MG tablet Take 12.5 mg by mouth at bedtime.  12/02/14   [provider]  zolpidem (AMBIEN) 5 MG tablet Take 2.5 mg by mouth at bedtime as needed for sleep.  07/24/18   [provider]  Family History No family history on file.  Social History Social History   Tobacco Use  . Smoking status: Never Smoker  . Smokeless tobacco: Never Used  Substance Use Topics  . Alcohol use: Yes    Comment: occasionally   . Drug use: No     Allergies   Amoxicillin and Penicillins   Review of Systems Review of Systems  All other systems reviewed and are negative.    Physical Exam Updated Vital Signs BP (!) 148/86   Temp 98.6 F (37 C) (Oral)   SpO2 100%   Physical Exam Vitals signs and nursing note reviewed.  Constitutional:      General: She is not in acute distress.    Appearance: Normal appearance. She  is well-developed. She is not ill-appearing, toxic-appearing or diaphoretic.  HENT:     Head: Normocephalic and atraumatic.     Right Ear: External ear normal.     Left Ear: External ear normal.  Eyes:     Conjunctiva/sclera: Conjunctivae normal.     Pupils: Pupils are equal, round, and reactive to light.  Neck:     Musculoskeletal: Normal range of motion and neck supple.     Trachea: Phonation normal.  Cardiovascular:     Rate and Rhythm: Normal rate and regular rhythm.     Heart sounds: Normal heart sounds.  Pulmonary:     Effort: Pulmonary effort is normal.     Breath sounds: Normal breath sounds.  Musculoskeletal: Normal range of motion.  Skin:    General: Skin is warm and dry.  Neurological:     Mental Status: She is alert and oriented to person, place, and time.     Cranial Nerves: No cranial nerve deficit.     Sensory: No sensory deficit.     Motor: No abnormal muscle tone.     Coordination: Coordination normal.     Comments: No dysarthria, aphasia or nystagmus.  No pronator drift.  Normal finger-to-nose and heel-to-shin, bilaterally.  Normal strength arms legs bilaterally.  Psychiatric:        Behavior: Behavior normal.        Thought Content: Thought content normal.        Judgment: Judgment normal.     Comments: Mildly anxious      ED Treatments / Results  Labs (all labs ordered are listed, but only abnormal results are displayed) Labs Reviewed - No data to display  EKG None  Radiology No results found.  Procedures Procedures (including critical care time)  Medications Ordered in ED Medications - No data to display   Initial Impression / Assessment and Plan / ED Course  I have reviewed the triage vital signs and the nursing notes.  Pertinent labs & imaging results that were available during my care of the patient were reviewed by me and considered in my medical decision making (see chart for details).         Patient Vitals for the past 24  hrs:  BP Temp Temp src SpO2  12/23/18 1641 (!) 148/86 - - -  12/23/18 1603 (!) 145/94 98.6 F (37 C) Oral 100 %    5:01 PM Reevaluation with update and discussion. After initial assessment and treatment, an updated evaluation reveals no change in clinical status, findings discussed with the patient and all questions were answered. Mancel Bale   Medical Decision Making: Nonspecific headache.  Patient with ongoing neurologic symptoms for at least 6 months.  Symptoms are varied, and presentation today is  benign.  Doubt CVA, TIA, acute intracranial abnormality or unstable psychiatric condition.  Doubt hypertensive urgency.  Indication for further ED evaluation and treatment at this time.  CRITICAL CARE-no Performed by: Mancel Bale  Nursing Notes Reviewed/ Care Coordinated Applicable Imaging Reviewed Interpretation of Laboratory Data incorporated into ED treatment  The patient appears reasonably screened and/or stabilized for discharge and I doubt any other medical condition or other [facility] requiring further screening, evaluation, or treatment in the ED at this time prior to discharge.  Plan: Home Medications-continue usual; Home Treatments-rest, stress management; return here if the recommended treatment, does not improve the symptoms; Recommended follow up-ECP and neurology PRN.  Consider seeing therapy.    Final Clinical Impressions(s) / ED Diagnoses   Final diagnoses:  Nonintractable headache, unspecified chronicity pattern, unspecified headache type    ED Discharge Orders    None       Mancel Bale, MD 12/23/18 1702

## 2018-12-23 NOTE — ED Triage Notes (Signed)
Pt reports earlier she had pain in her right eye, then throughout her body. Pt reports she checked her BP and was 180s. Called nurse line who advised to go to Ed for possible stroke/TIA.

## 2018-12-23 NOTE — ED Notes (Signed)
Pt left before discharge paperwork could be reviewed.

## 2018-12-23 NOTE — Discharge Instructions (Addendum)
There are no signs of headache complications including TIA, stroke or meningitis.  Continue taking your blood pressure medications, and stay on a low-salt diet to improve your blood pressure.  Consider talking to a therapist regarding stress management, and stress relief techniques.  Discuss ongoing headaches with your PCP, or neurologist.  Return here if needed.

## 2019-01-06 ENCOUNTER — Other Ambulatory Visit: Payer: Self-pay

## 2019-01-06 ENCOUNTER — Encounter (HOSPITAL_COMMUNITY): Payer: Self-pay

## 2019-01-06 ENCOUNTER — Emergency Department (HOSPITAL_COMMUNITY)
Admission: EM | Admit: 2019-01-06 | Discharge: 2019-01-06 | Disposition: A | Discharge status: Home / Self Care | Payer: 59 | Attending: Emergency Medicine | Admitting: Emergency Medicine

## 2019-01-06 DIAGNOSIS — Z79899 Other long term (current) drug therapy: Secondary | ICD-10-CM | POA: Insufficient documentation

## 2019-01-06 DIAGNOSIS — Z7982 Long term (current) use of aspirin: Secondary | ICD-10-CM | POA: Insufficient documentation

## 2019-01-06 DIAGNOSIS — I10 Essential (primary) hypertension: Secondary | ICD-10-CM | POA: Diagnosis not present

## 2019-01-06 NOTE — ED Triage Notes (Signed)
Pt states that she called her dr's office, who told her to come to the ED if her diastolic BP was >100.

## 2019-01-06 NOTE — ED Provider Notes (Signed)
[facility]-EMERGENCY DEPT Provider Note   CSN: [id number] Arrival date & time: 01/06/19  1436    History   Chief Complaint Chief Complaint  Patient presents with  . Hypertension    HPI Caitlyn Wilson is a 53 y.o. female.     The history is provided by the patient.  Hypertension  This is a chronic problem. The problem occurs every several days. The problem has not changed since onset.Pertinent negatives include no chest pain, no abdominal pain and no shortness of breath. Nothing aggravates the symptoms. Nothing relieves the symptoms. She has tried nothing for the symptoms. The treatment provided no relief.    Past Medical History:  Diagnosis Date  . Empty sella syndrome (HCC)   . GERD (gastroesophageal reflux disease)   . Headache(784.0)    migraines many yrs ago  . Heart murmur   . Hypercholesteremia   . Hypertension   . Vertigo     Patient Active Problem List   Diagnosis Date Noted  . Diarrhea 01/30/2013    Past Surgical History:  Procedure Laterality Date  . BREAST BIOPSY Left 10/07/2006   benign  . COLONOSCOPY WITH PROPOFOL N/A 01/11/2014   Procedure: COLONOSCOPY WITH PROPOFOL;  Surgeon: Charolett BumpersMartin K Johnson, MD;  Location: [facility];  Service: Endoscopy;  Laterality: N/A;  . TONSILLECTOMY       OB History   No obstetric history on file.      Home Medications    Prior to Admission medications   Medication Sig Start Date End Date Taking? Authorizing Provider  ALPRAZolam Prudy Feeler(XANAX) 0.5 MG tablet Take 0.25-0.5 mg by mouth daily as needed for anxiety. 08/14/18   [provider]  aspirin EC 81 MG tablet Take 1 tablet (81 mg total) by mouth daily. 08/19/18   Aviva KluverMurray, Alyssa B, PA-C  atenolol (TENORMIN) 50 MG tablet Take 12.5 mg by mouth at bedtime.  07/10/18   [provider]  hydrochlorothiazide (HYDRODIURIL) 25 MG tablet Take 12.5 mg by mouth at bedtime.  12/02/14   [provider]  zolpidem (AMBIEN) 5 MG tablet  Take 2.5 mg by mouth at bedtime as needed for sleep.  07/24/18   [provider]    Family History No family history on file.  Social History Social History   Tobacco Use  . Smoking status: Never Smoker  . Smokeless tobacco: Never Used  Substance Use Topics  . Alcohol use: Yes    Comment: occasionally   . Drug use: No     Allergies   Amoxicillin and Penicillins   Review of Systems Review of Systems  Constitutional: Negative for chills and fever.  HENT: Negative for ear pain and sore throat.   Eyes: Negative for pain and visual disturbance.  Respiratory: Negative for cough and shortness of breath.   Cardiovascular: Negative for chest pain and palpitations.  Gastrointestinal: Negative for abdominal pain and vomiting.  Genitourinary: Negative for dysuria and hematuria.  Musculoskeletal: Negative for arthralgias and back pain.  Skin: Negative for color change and rash.  Neurological: Negative for seizures and syncope.  All other systems reviewed and are negative.    Physical Exam Updated Vital Signs BP (!) 144/86   Pulse (!) 48   Resp 18   Wt 77 kg   SpO2 98%   BMI 33.15 kg/m   Physical Exam Vitals signs and nursing note reviewed.  Constitutional:      General: She is not in acute distress.    Appearance: She is  well-developed.  HENT:     Head: Normocephalic and atraumatic.     Nose: Nose normal.     Mouth/Throat:     Mouth: Mucous membranes are moist.  Eyes:     Conjunctiva/sclera: Conjunctivae normal.  Neck:     Musculoskeletal: Neck supple.  Cardiovascular:     Rate and Rhythm: Regular rhythm. Bradycardia present.     Pulses: Normal pulses.     Heart sounds: Normal heart sounds. No murmur.  Pulmonary:     Effort: Pulmonary effort is normal. No respiratory distress.     Breath sounds: Normal breath sounds.  Skin:    General: Skin is warm and dry.  Neurological:     Mental Status: She is alert.     Cranial Nerves: No cranial nerve  deficit.     Sensory: No sensory deficit.     Motor: No weakness.     Coordination: Coordination normal.     Gait: Gait normal.      ED Treatments / Results  Labs (all labs ordered are listed, but only abnormal results are displayed) Labs Reviewed - No data to display  EKG EKG Interpretation  Date/Time:  Wednesday January 06 2019 15:00:24 EDT Ventricular Rate:  49 PR Interval:    QRS Duration: 87 QT Interval:  429 QTC Calculation: 388 R Axis:   15 Text Interpretation:  Sinus bradycardia No significant change since last tracing Confirmed by Virgina Norfolk [phone]) on 01/06/2019 3:03:27 PM   Radiology No results found.  Procedures Procedures (including critical care time)  Medications Ordered in ED Medications - No data to display   Initial Impression / Assessment and Plan / ED Course  I have reviewed the triage vital signs and the nursing notes.  Pertinent labs & imaging results that were available during my care of the patient were reviewed by me and considered in my medical decision making (see chart for details).     Caitlyn Wilson is a 53 year old female history of hypertension, high cholesterol who presents to the ED with hypertension.  Patient with overall unremarkable blood pressure.  Heart rate in the 50s.  EKG shows sinus rhythm.  No signs of ischemic changes.  Unchanged from prior EKGs.  Patient denies any chest pain, shortness of breath, headache, neurological symptoms.  She is overall very well-appearing.  She has follow-up with her primary care doctor tomorrow to discuss her blood pressure medications.  They sent her for evaluation as her diastolic blood pressure was consistently over 100.  However, patient has no signs of volume overload on exam.  No concern for heart failure.  History and physical is not consistent with ACS or PE.  She is overall asymptomatic with fairly normal blood pressure.  Given return precautions and discharged from ED in good condition.   She has follow-up primary care doctor tomorrow.  This chart was dictated using voice recognition software.  Despite best efforts to proofread,  errors can occur which can change the documentation meaning.    Final Clinical Impressions(s) / ED Diagnoses   Final diagnoses:  Hypertension, unspecified type    ED Discharge Orders    None       Virgina Norfolk, DO 01/06/19 1510

## 2019-03-18 ENCOUNTER — Encounter (HOSPITAL_COMMUNITY): Payer: Self-pay | Admitting: Obstetrics and Gynecology

## 2019-03-18 ENCOUNTER — Emergency Department (HOSPITAL_COMMUNITY)
Admission: EM | Admit: 2019-03-18 | Discharge: 2019-03-19 | Disposition: A | Discharge status: Home / Self Care | Payer: 59 | Attending: Emergency Medicine | Admitting: Emergency Medicine

## 2019-03-18 ENCOUNTER — Other Ambulatory Visit: Payer: Self-pay

## 2019-03-18 DIAGNOSIS — Z79899 Other long term (current) drug therapy: Secondary | ICD-10-CM | POA: Insufficient documentation

## 2019-03-18 DIAGNOSIS — Z7982 Long term (current) use of aspirin: Secondary | ICD-10-CM | POA: Insufficient documentation

## 2019-03-18 DIAGNOSIS — I10 Essential (primary) hypertension: Secondary | ICD-10-CM

## 2019-03-18 DIAGNOSIS — M79669 Pain in unspecified lower leg: Secondary | ICD-10-CM | POA: Diagnosis not present

## 2019-03-18 DIAGNOSIS — R001 Bradycardia, unspecified: Secondary | ICD-10-CM | POA: Diagnosis not present

## 2019-03-18 NOTE — ED Triage Notes (Signed)
Pt reports that her BP has been increasing throughout the day and she can feel it. Pt called the triage nurse line and they told her to come here. Pt reports it was around 165/105 Pt reports tingling in her left arm and leg as well as a headache.  Pt denies slurred speech or difficulty walking.

## 2019-03-19 ENCOUNTER — Emergency Department (HOSPITAL_COMMUNITY)
Admission: EM | Admit: 2019-03-19 | Discharge: 2019-03-20 | Disposition: A | Discharge status: Home / Self Care | Payer: 59 | Source: Home / Self Care | Attending: Emergency Medicine | Admitting: Emergency Medicine

## 2019-03-19 ENCOUNTER — Emergency Department (HOSPITAL_COMMUNITY): Payer: 59

## 2019-03-19 ENCOUNTER — Other Ambulatory Visit: Payer: Self-pay

## 2019-03-19 ENCOUNTER — Encounter (HOSPITAL_COMMUNITY): Payer: Self-pay

## 2019-03-19 DIAGNOSIS — R202 Paresthesia of skin: Secondary | ICD-10-CM | POA: Insufficient documentation

## 2019-03-19 DIAGNOSIS — Z79899 Other long term (current) drug therapy: Secondary | ICD-10-CM | POA: Insufficient documentation

## 2019-03-19 DIAGNOSIS — I10 Essential (primary) hypertension: Secondary | ICD-10-CM | POA: Insufficient documentation

## 2019-03-19 DIAGNOSIS — Z7982 Long term (current) use of aspirin: Secondary | ICD-10-CM | POA: Insufficient documentation

## 2019-03-19 LAB — PROTIME-INR
INR: 1.1 (ref 0.8–1.2)
Prothrombin Time: 13.6 seconds (ref 11.4–15.2)

## 2019-03-19 LAB — COMPREHENSIVE METABOLIC PANEL
ALT: 15 U/L (ref 0–44)
AST: 23 U/L (ref 15–41)
Albumin: 4 g/dL (ref 3.5–5.0)
Alkaline Phosphatase: 52 U/L (ref 38–126)
Anion gap: 10 (ref 5–15)
BUN: 9 mg/dL (ref 6–20)
CO2: 25 mmol/L (ref 22–32)
Calcium: 9.4 mg/dL (ref 8.9–10.3)
Chloride: 102 mmol/L (ref 98–111)
Creatinine, Ser: 0.82 mg/dL (ref 0.44–1.00)
GFR calc Af Amer: >60 mL/min (ref >60)
GFR calc non Af Amer: >60 mL/min (ref >60)
Glucose, Bld: 84 mg/dL (ref 70–99)
Potassium: 3.3 mmol/L — ABNORMAL LOW (ref 3.5–5.1)
Sodium: 137 mmol/L (ref 135–145)
Total Bilirubin: 0.8 mg/dL (ref 0.3–1.2)
Total Protein: 7.7 g/dL (ref 6.5–8.1)

## 2019-03-19 LAB — DIFFERENTIAL
Abs Immature Granulocytes: 0.01 10*3/uL (ref 0.00–0.07)
Basophils Absolute: 0 10*3/uL (ref 0.0–0.1)
Basophils Relative: 1 %
Eosinophils Absolute: 0 10*3/uL (ref 0.0–0.5)
Eosinophils Relative: 0 %
Immature Granulocytes: 0 %
Lymphocytes Relative: 30 %
Lymphs Abs: 2.3 10*3/uL (ref 0.7–4.0)
Monocytes Absolute: 0.6 10*3/uL (ref 0.1–1.0)
Monocytes Relative: 7 %
Neutro Abs: 4.9 10*3/uL (ref 1.7–7.7)
Neutrophils Relative %: 62 %

## 2019-03-19 LAB — CBC
HCT: 42.7 % (ref 36.0–46.0)
Hemoglobin: 14.3 g/dL (ref 12.0–15.0)
MCH: 31.1 pg (ref 26.0–34.0)
MCHC: 33.5 g/dL (ref 30.0–36.0)
MCV: 92.8 fL (ref 80.0–100.0)
Platelets: 275 10*3/uL (ref 150–400)
RBC: 4.6 MIL/uL (ref 3.87–5.11)
RDW: 12 % (ref 11.5–15.5)
WBC: 7.9 10*3/uL (ref 4.0–10.5)
nRBC: 0 % (ref 0.0–0.2)

## 2019-03-19 LAB — APTT: aPTT: 25 seconds (ref 24–36)

## 2019-03-19 LAB — I-STAT BETA HCG BLOOD, ED (MC, WL, AP ONLY): I-stat hCG, quantitative: <5 m[IU]/mL (ref <5)

## 2019-03-19 MED ORDER — SODIUM CHLORIDE 0.9% FLUSH
3.0000 mL | Freq: Once | INTRAVENOUS | Status: DC
Start: 2019-03-19 — End: 2019-03-20

## 2019-03-19 NOTE — ED Provider Notes (Signed)
[facility]-EMERGENCY DEPT Provider Note   CSN: [id number] Arrival date & time: 03/18/19  1827    History   Chief Complaint Chief Complaint  Patient presents with  . Hypertension  . Leg Pain    HPI Caitlyn Wilson is a 53 y.o. female.     Patient presents to the emergency department for evaluation of elevated blood pressure.  Patient does have a history of hypertension.  She has been on atenolol and hydrochlorothiazide for many years.  She reports that occasionally she checks her blood pressure and it is elevated.  Usually it is fairly well-controlled with this medication.  She has, however, had to come to the ER in the past for elevated blood pressures.  She reports that she checked her pressure earlier tonight and it was elevated.  She checked it several more times and it kept going higher, was as high as 170s over 105.  She called the nurse line and was instructed to come to the ER.     Past Medical History:  Diagnosis Date  . Empty sella syndrome (HCC)   . GERD (gastroesophageal reflux disease)   . Headache(784.0)    migraines many yrs ago  . Heart murmur   . Hypercholesteremia   . Hypertension   . Vertigo     Patient Active Problem List   Diagnosis Date Noted  . Diarrhea 01/30/2013    Past Surgical History:  Procedure Laterality Date  . BREAST BIOPSY Left 10/07/2006   benign  . COLONOSCOPY WITH PROPOFOL N/A 01/11/2014   Procedure: COLONOSCOPY WITH PROPOFOL;  Surgeon: Charolett BumpersMartin K Johnson, MD;  Location: [facility];  Service: Endoscopy;  Laterality: N/A;  . TONSILLECTOMY       OB History   No obstetric history on file.      Home Medications    Prior to Admission medications   Medication Sig Start Date End Date Taking? Authorizing Provider  ALPRAZolam Prudy Feeler(XANAX) 0.5 MG tablet Take 0.25-0.5 mg by mouth daily as needed for anxiety. 08/14/18   [provider]  aspirin EC 81 MG tablet Take 1 tablet (81 mg total) by mouth daily. 08/19/18    Aviva KluverMurray, Alyssa B, PA-C  atenolol (TENORMIN) 50 MG tablet Take 12.5 mg by mouth at bedtime.  07/10/18   [provider]  hydrochlorothiazide (HYDRODIURIL) 25 MG tablet Take 12.5 mg by mouth at bedtime.  12/02/14   [provider]  zolpidem (AMBIEN) 5 MG tablet Take 2.5 mg by mouth at bedtime as needed for sleep.  07/24/18   [provider]    Family History No family history on file.  Social History Social History   Tobacco Use  . Smoking status: Never Smoker  . Smokeless tobacco: Never Used  Substance Use Topics  . Alcohol use: Yes    Comment: occasionally   . Drug use: No     Allergies   Amoxicillin and Penicillins   Review of Systems Review of Systems  Respiratory: Negative for shortness of breath.   Cardiovascular: Negative for chest pain and palpitations.  All other systems reviewed and are negative.    Physical Exam Updated Vital Signs BP (!) 152/80   Pulse (!) 49   Temp 97.9 F (36.6 C) (Oral)   Resp 15   SpO2 98%   Physical Exam Vitals signs and nursing note reviewed.  Constitutional:      General: She is not in acute distress.    Appearance: Normal appearance. She is well-developed.  HENT:  Head: Normocephalic and atraumatic.     Right Ear: Hearing normal.     Left Ear: Hearing normal.     Nose: Nose normal.  Eyes:     Conjunctiva/sclera: Conjunctivae normal.     Pupils: Pupils are equal, round, and reactive to light.  Neck:     Musculoskeletal: Normal range of motion and neck supple.  Cardiovascular:     Rate and Rhythm: Regular rhythm. Bradycardia present.     Heart sounds: S1 normal and S2 normal. No murmur. No friction rub. No gallop.   Pulmonary:     Effort: Pulmonary effort is normal. No respiratory distress.     Breath sounds: Normal breath sounds.  Chest:     Chest wall: No tenderness.  Abdominal:     General: Bowel sounds are normal.     Palpations: Abdomen is soft.     Tenderness: There is no  abdominal tenderness. There is no guarding or rebound. Negative signs include Murphy's sign and McBurney's sign.     Hernia: No hernia is present.  Musculoskeletal: Normal range of motion.  Skin:    General: Skin is warm and dry.     Findings: No rash.  Neurological:     Mental Status: She is alert and oriented to person, place, and time.     GCS: GCS eye subscore is 4. GCS verbal subscore is 5. GCS motor subscore is 6.     Cranial Nerves: No cranial nerve deficit.     Sensory: No sensory deficit.     Coordination: Coordination normal.  Psychiatric:        Speech: Speech normal.        Behavior: Behavior normal.        Thought Content: Thought content normal.      ED Treatments / Results  Labs (all labs ordered are listed, but only abnormal results are displayed) Labs Reviewed - No data to display  EKG None  Radiology No results found.  Procedures Procedures (including critical care time)  Medications Ordered in ED Medications - No data to display   Initial Impression / Assessment and Plan / ED Course  I have reviewed the triage vital signs and the nursing notes.  Pertinent labs & imaging results that were available during my care of the patient were reviewed by me and considered in my medical decision making (see chart for details).        Patient presented for evaluation of hypertension because the nurse line for her doctor's office told her to come to the ER.  Her blood pressure is not dangerously high tonight.  Blood pressure currently 152/80 at time of my examination.  She feels well.  I do not believe she requires any work-up.  She does, however, have bradycardia.  This likely secondary to her atenolol.  She has noticed weakness and dizziness.  She reports that when she stands up she feels very dizzy, like she is going to pass out.  She has been trying to start to exercise, but cannot exercise because of this dizziness.  She has even been taking half of the  atenolol tablet in an attempt to diminish her dizziness but it has not helped.  Atenolol does not appear to be the correct medication for the patient's blood pressure issues.  She is still experiencing spikes, has significant symptoms from the medication secondary to her bradycardia and there is no ability to increase the medication for her elevated pressures.  I discussed this with the  patient.  She feels comfortable going home and contacting her primary doctor about medication changes.  Final Clinical Impressions(s) / ED Diagnoses   Final diagnoses:  Essential hypertension  Bradycardia    ED Discharge Orders    None       Samy Ryner, Canary Brimhristopher J, MD 03/19/19 0008

## 2019-03-19 NOTE — ED Triage Notes (Signed)
Pt reports htn since yesterday, pt seen at an Ed last night but "they didn't do anything" was told to follow up with PCP to get medication changes,. Pt reports BP in 754G systolic. Pt also c.o left foot and hand numbness since 1830 tonight, no other symptoms. Pt a.o

## 2019-03-19 NOTE — Discharge Instructions (Addendum)
Your blood pressure was elevated today.  It was not dangerously elevated.  You do, however, have a very low heart rate secondary to your current blood pressure medicine.  I believe that you will need to have this medication change, as it is causing you to feel weak and dizzy and not completely controlling your blood pressure.  Please contact your primary care doctor for further consideration of this.

## 2019-03-20 MED ORDER — AMLODIPINE BESYLATE 5 MG PO TABS: 2.5000 mg | ORAL_TABLET | Freq: Every day | ORAL | 6 refills | Status: AC

## 2019-03-20 MED ORDER — AMLODIPINE BESYLATE 5 MG PO TABS
2.5000 mg | ORAL_TABLET | Freq: Once | ORAL | Status: AC
  Administered 2019-03-20: 2.5 mg via ORAL
  Filled 2019-03-20: qty 1

## 2019-03-20 NOTE — ED Notes (Signed)
Pt denies any current numbness/tinglinging.

## 2019-03-20 NOTE — Discharge Instructions (Signed)
Stop taking the atenolol.  Start taking the Norvasc as prescribed.  This will be a half a tablet daily.  This might not be enough, will take your blood pressure just once daily around noon.  Take the medicine in the morning.  If blood pressure is staying high, increase to the full tablet.  Your maximum dose will be 10 mg in a day (2 tablets), so you could take an extra tablet on any given day if you are noticing that the blood pressure is staying elevated.

## 2019-03-20 NOTE — ED Provider Notes (Signed)
[facility] EMERGENCY DEPARTMENT Provider Note   CSN: [id number] Arrival date & time: 03/19/19  2017    History   Chief Complaint Chief Complaint  Patient presents with  . Headache  . Hypertension  . Numbness    HPI Caitlyn Wilson is a 53 y.o. female.     Patient presents to the emergency department for evaluation of headache with left arm and foot numbness.  I did see this patient last night at [facility] with complaints of elevated blood pressure and headache.  Her work-up at that time was reassuring and she was to follow-up with her primary doctor today.  She reports that she called first thing this morning to try and get a tele-visit and some consideration for changes of her medications.  She reports that she waited all day but never got a call back.  She called the tele-nurse tonight because she developed headache which she knows is usually related to her blood pressure.  She then had worsening numbness and tingling of the left hand and left leg and was directed back to the ER.  She has been waiting for some time in the waiting room.  At my time of evaluation, numbness and tingling have completely resolved and her blood pressure is improved here.     Past Medical History:  Diagnosis Date  . Empty sella syndrome ([place])   . GERD (gastroesophageal reflux disease)   . Headache(784.0)    migraines many yrs ago  . Heart murmur   . Hypercholesteremia   . Hypertension   . Vertigo     Patient Active Problem List   Diagnosis Date Noted  . Diarrhea 01/30/2013    Past Surgical History:  Procedure Laterality Date  . BREAST BIOPSY Left 10/07/2006   benign  . COLONOSCOPY WITH PROPOFOL N/A 01/11/2014   Procedure: COLONOSCOPY WITH PROPOFOL;  Surgeon: Garlan Fair, MD;  Location: [facility];  Service: Endoscopy;  Laterality: N/A;  . TONSILLECTOMY       OB History   No obstetric history on file.      Home Medications    Prior to Admission  medications   Medication Sig Start Date End Date Taking? Authorizing Provider  ALPRAZolam Duanne Moron) 0.5 MG tablet Take 0.25-0.5 mg by mouth daily as needed for anxiety. 08/14/18   [provider]  amLODipine (NORVASC) 5 MG tablet Take 0.5 tablets (2.5 mg total) by mouth daily. 03/20/19   Orpah Greek, MD  aspirin EC 81 MG tablet Take 1 tablet (81 mg total) by mouth daily. 08/19/18   Langston Masker B, PA-C  hydrochlorothiazide (HYDRODIURIL) 25 MG tablet Take 12.5 mg by mouth at bedtime.  12/02/14   [provider]  zolpidem (AMBIEN) 5 MG tablet Take 2.5 mg by mouth at bedtime as needed for sleep.  07/24/18   [provider]  atenolol (TENORMIN) 50 MG tablet Take 12.5 mg by mouth at bedtime.  07/10/18 03/20/19  [provider]    Family History No family history on file.  Social History Social History   Tobacco Use  . Smoking status: Never Smoker  . Smokeless tobacco: Never Used  Substance Use Topics  . Alcohol use: Yes    Comment: occasionally   . Drug use: No     Allergies   Amoxicillin and Penicillins   Review of Systems Review of Systems  Neurological: Positive for numbness and headaches.  All other systems reviewed and are negative.    Physical Exam  Updated Vital Signs BP (!) 150/79 (BP Location: Left Arm)   Pulse (!) 59   Temp 98 F (36.7 C) (Oral)   Resp 15   SpO2 100%   Physical Exam Vitals signs and nursing note reviewed.  Constitutional:      General: She is not in acute distress.    Appearance: Normal appearance. She is well-developed.  HENT:     Head: Normocephalic and atraumatic.     Right Ear: Hearing normal.     Left Ear: Hearing normal.     Nose: Nose normal.  Eyes:     Conjunctiva/sclera: Conjunctivae normal.     Pupils: Pupils are equal, round, and reactive to light.  Neck:     Musculoskeletal: Normal range of motion and neck supple.  Cardiovascular:     Rate and Rhythm: Regular rhythm.     Heart  sounds: S1 normal and S2 normal. No murmur. No friction rub. No gallop.   Pulmonary:     Effort: Pulmonary effort is normal. No respiratory distress.     Breath sounds: Normal breath sounds.  Chest:     Chest wall: No tenderness.  Abdominal:     General: Bowel sounds are normal.     Palpations: Abdomen is soft.     Tenderness: There is no abdominal tenderness. There is no guarding or rebound. Negative signs include Murphy's sign and McBurney's sign.     Hernia: No hernia is present.  Musculoskeletal: Normal range of motion.  Skin:    General: Skin is warm and dry.     Findings: No rash.  Neurological:     Mental Status: She is alert and oriented to person, place, and time.     GCS: GCS eye subscore is 4. GCS verbal subscore is 5. GCS motor subscore is 6.     Cranial Nerves: No cranial nerve deficit.     Sensory: No sensory deficit.     Coordination: Coordination normal.     Deep Tendon Reflexes:     Reflex Scores:      Bicep reflexes are 2+ on the right side and 2+ on the left side.      Patellar reflexes are 2+ on the right side and 2+ on the left side. Psychiatric:        Speech: Speech normal.        Behavior: Behavior normal.        Thought Content: Thought content normal.      ED Treatments / Results  Labs (all labs ordered are listed, but only abnormal results are displayed) Labs Reviewed  COMPREHENSIVE METABOLIC PANEL - Abnormal; Notable for the following components:      Result Value   Potassium 3.3 (*)    All other components within normal limits  PROTIME-INR  APTT  CBC  DIFFERENTIAL  I-STAT BETA HCG BLOOD, ED ([facility], [facility], [facility] ONLY)    EKG EKG Interpretation  Date/Time:  Friday March 19 2019 20:34:38 EDT Ventricular Rate:  58 PR Interval:  152 QRS Duration: 80 QT Interval:  378 QTC Calculation: 371 R Axis:   14 Text Interpretation:  Sinus bradycardia Septal infarct , age undetermined Abnormal ECG No significant change since last tracing Confirmed by  Gilda CreasePollina, Cornelious Bartolucci J [id number]) on 03/20/2019 12:36:18 AM   Radiology Ct Head Wo Contrast  Result Date: 03/19/2019 CLINICAL DATA:  Severe headache EXAM: CT HEAD WITHOUT CONTRAST TECHNIQUE: Contiguous axial images were obtained from the base of the skull through the vertex without intravenous contrast.  COMPARISON:  12/06/2018 FINDINGS: Brain: No evidence of acute infarction, hemorrhage, hydrocephalus, extra-axial collection or mass lesion/mass effect. Vascular: No hyperdense vessel or unexpected calcification. Skull: Normal. Negative for fracture or focal lesion. Sinuses/Orbits: No acute finding. Other: Non IMPRESSION: Negative non contrasted CT appearance of the brain Electronically Signed   By: Jasmine PangKim  Fujinaga M.D.   On: 03/19/2019 22:03    Procedures Procedures (including critical care time)  Medications Ordered in ED Medications  sodium chloride flush (NS) 0.9 % injection 3 mL (has no administration in time range)  amLODipine (NORVASC) tablet 2.5 mg (has no administration in time range)     Initial Impression / Assessment and Plan / ED Course  I have reviewed the triage vital signs and the nursing notes.  Pertinent labs & imaging results that were available during my care of the patient were reviewed by me and considered in my medical decision making (see chart for details).        Patient presents to the emergency department for evaluation of headache, generalized weakness, elevated blood pressure.  She also had intermittent episodes of left hand and left foot numbness today.  She reports that this would occur when her pressure was high but then get better as the pressure got better.  Currently symptoms have resolved.  She had a stroke screening evaluation ordered through triage.  This  included blood work, CT head.  This evaluation has been completely normal.  Examination at this time is also normal, absolutely no deficit.  She has normal strength, normal sensation, normal reflexes.  I  discussed with her the possibility of small stroke, although I feel that is very unlikely.  We discussed doing an MRI.  She does not wish to undergo MRI tonight as it would keep her here for much longer.  I do not feel strongly that she absolutely needs it.  At this point, as she has had difficulty getting through to her primary doctor for several days in a row to resolve this issue, I will change her blood pressure medicine from atenolol to Norvasc.  She says she has very sensitive to blood pressure medications and all medications in general.  She would like to start at a low dose.  Will start 2.5 mg.  She will check her blood pressure once daily and if pressure is staying high, she can increase to 5 mg.  This will give her some leeway to also take an extra dose intermittently as needed on the days when she has symptoms that caused her to check her pressure and it is elevated.  She will still need to follow-up with a primary care doctor at some point for continued monitoring of her blood pressure.  Final Clinical Impressions(s) / ED Diagnoses   Final diagnoses:  Essential hypertension  Paresthesia    ED Discharge Orders         Ordered    amLODipine (NORVASC) 5 MG tablet  Daily     03/20/19 0056           Gilda CreasePollina, Caidence Kaseman J, MD 03/20/19 [phone]

## 2019-09-18 IMAGING — MR MR HEAD W/O CM
10 of 12 series · 37 of 48 positions shown · non-contrast
Comparison: Brain MRI 06/01/2018

CLINICAL DATA: TIA

EXAM:
MRI HEAD WITHOUT CONTRAST
TECHNIQUE: Multiplanar, multiecho pulse sequences of the brain and surrounding
structures were obtained without intravenous contrast.

[Series 1: loc · axial · 6.0mm · 0.59mm/px · z∈[-22,+150]mm · 2 of 19 slices shown]
[im 1/19]
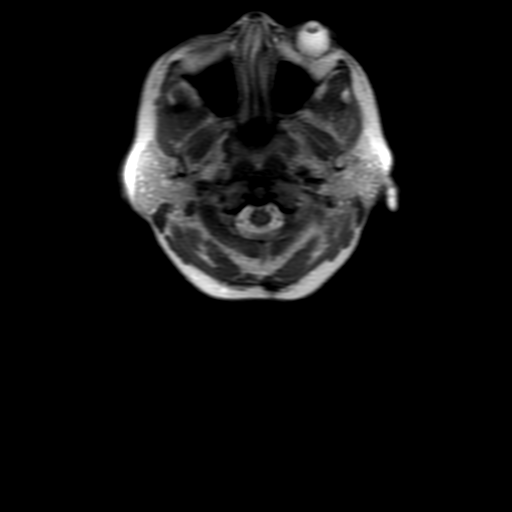
[im 19/19]
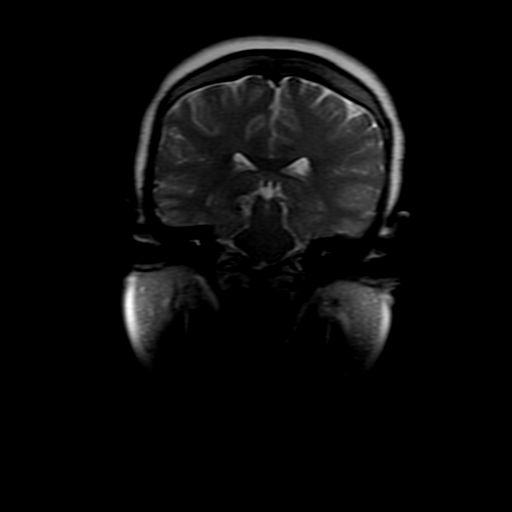

[Series 3: DWI · axial · 3.0mm · 1.09mm/px · z∈[-75,+61]mm · 8 of 98 slices shown (1 of 4)]
[im 1/98]
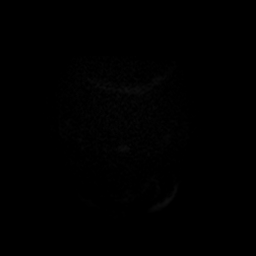
[im 14/98]
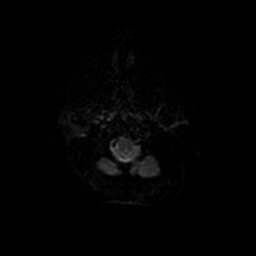
[im 28/98]
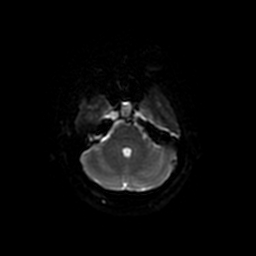
[im 42/98]
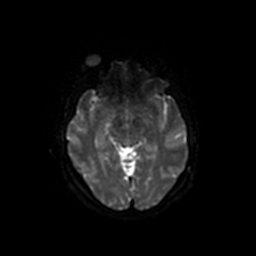
[im 56/98]
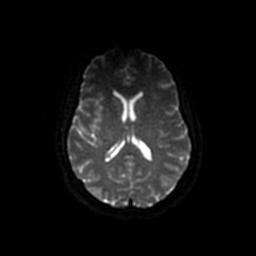
[im 70/98]
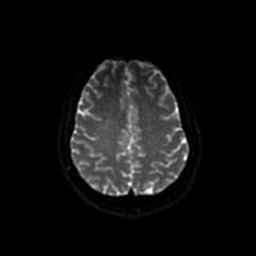
[im 84/98]
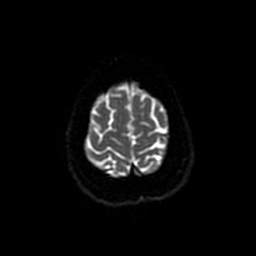
[im 98/98]
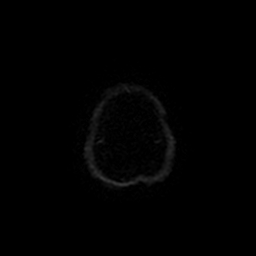

[Series 4: T1 · sagittal · 5.0mm · 0.47mm/px · 2 of 24 slices shown]
[im 1/24]
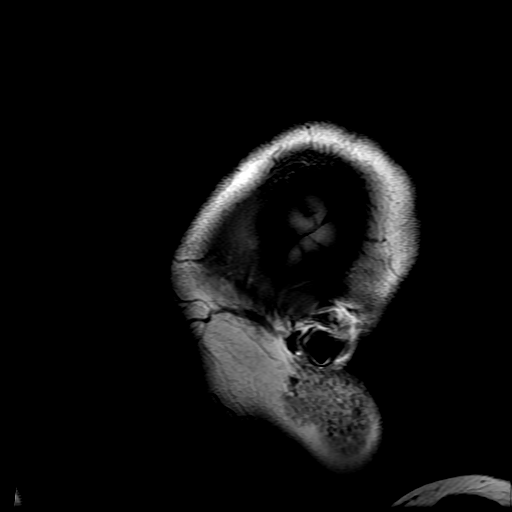
[im 24/24]
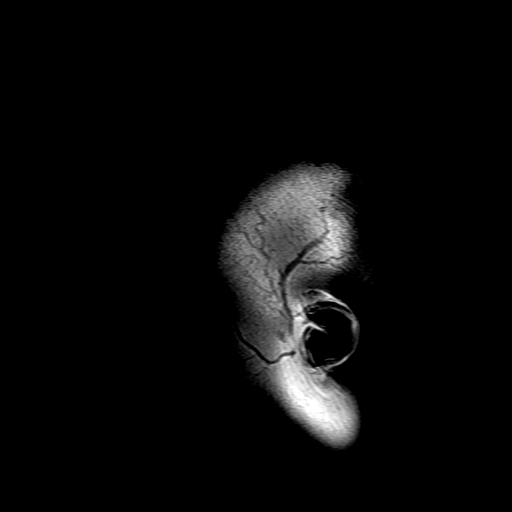

[Series 5: DWI · coronal · 3.0mm · 1.09mm/px · 8 of 122 slices shown (2 of 4)]
[im 1/122]
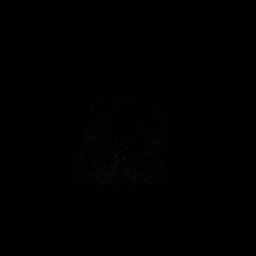
[im 14/122]
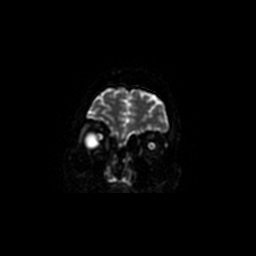
[im 41/122]
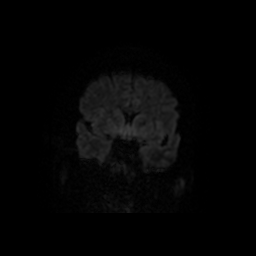
[im 54/122]
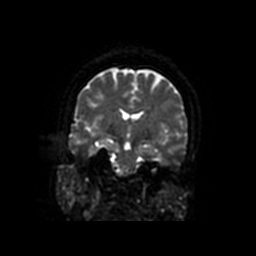
[im 68/122]
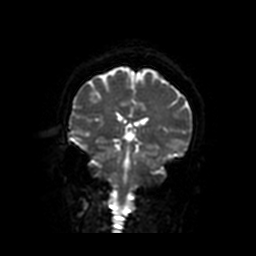
[im 81/122]
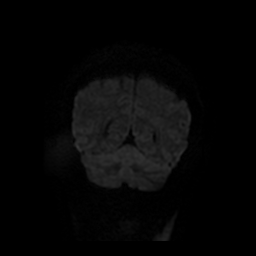
[im 108/122]
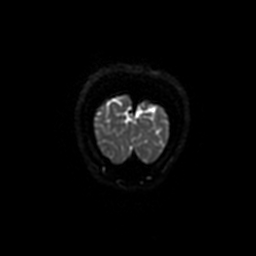
[im 122/122]
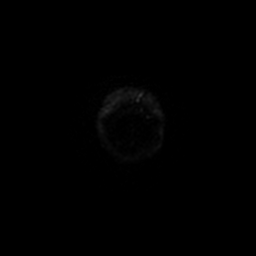

[Series 6: T2 · axial · 5.0mm · 0.47mm/px · z∈[-81,+51]mm · 2 of 22 slices shown (1 of 2)]
[im 1/22]
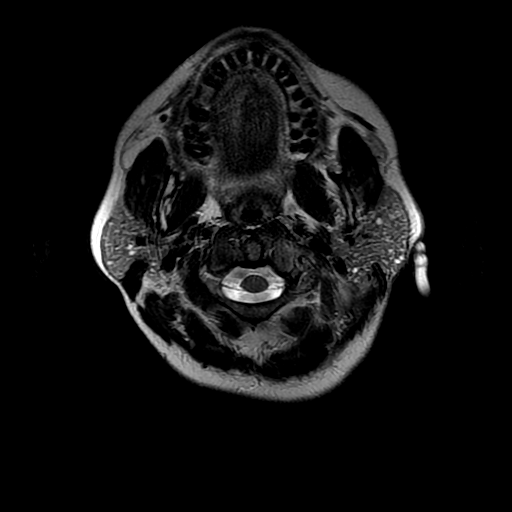
[im 22/22]
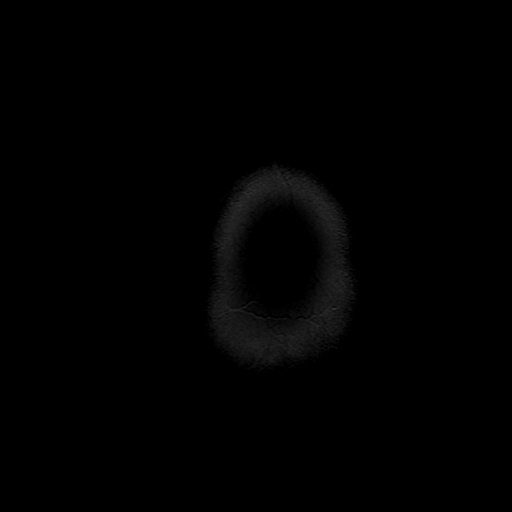

[Series 7: FLAIR · axial · 5.0mm · 0.47mm/px · z∈[-81,+51]mm · 2 of 22 slices shown]
[im 1/22]
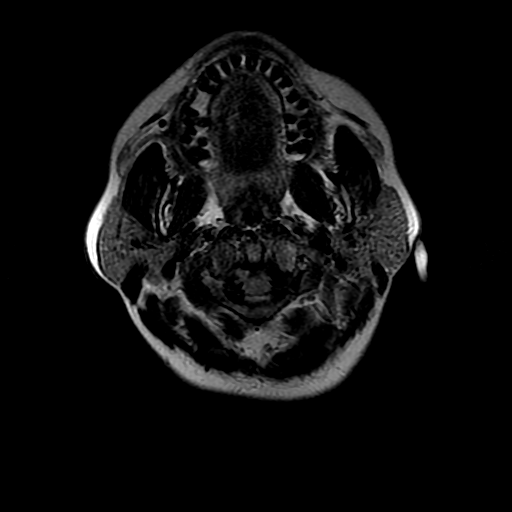
[im 22/22]
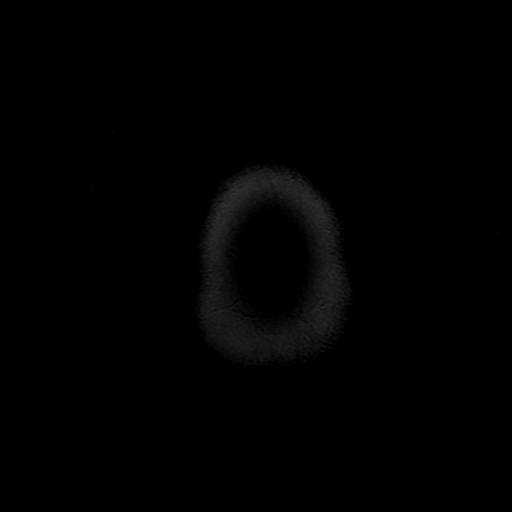

[Series 8: ax mpgr · axial · 5.0mm · 0.47mm/px · z∈[-81,+51]mm · 2 of 22 slices shown]
[im 1/22]
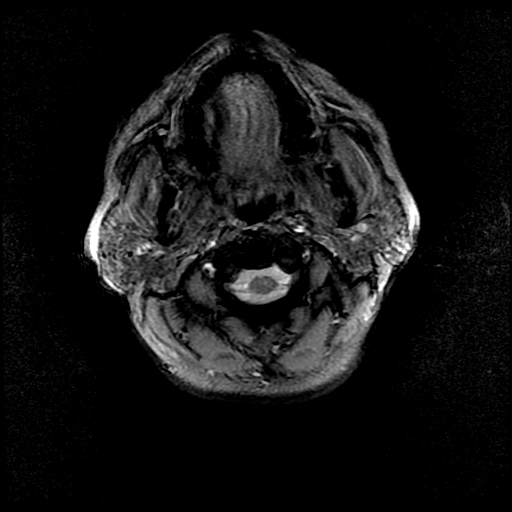
[im 22/22]
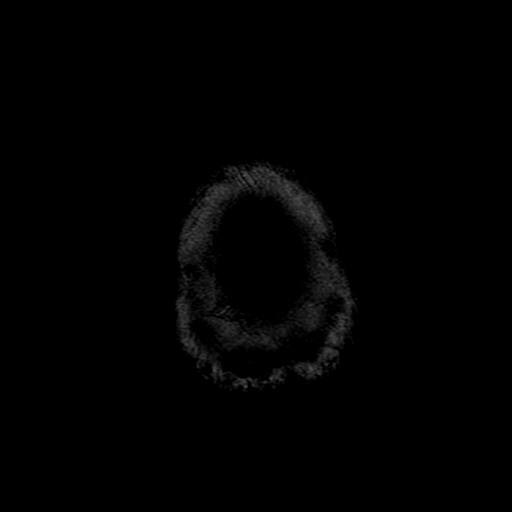

[Series 10: T2 · coronal · 5.0mm · 0.45mm/px · 2 of 30 slices shown (2 of 2)]
[im 1/30]
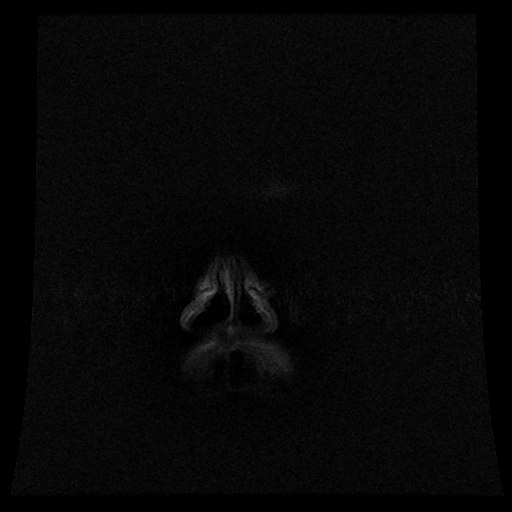
[im 30/30]
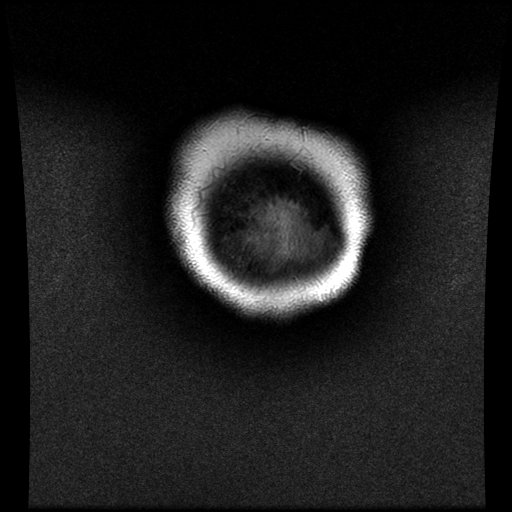

[Series 301: DWI · axial · 3.0mm · 1.09mm/px · z∈[-75,+61]mm · 4 of 49 slices shown (3 of 4)]
[im 1/49]
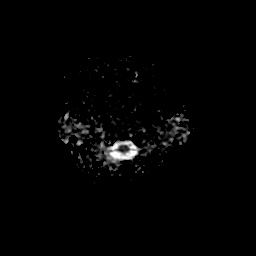
[im 17/49]
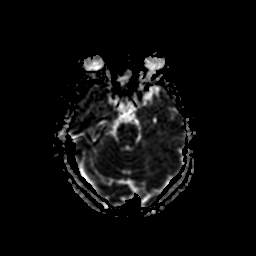
[im 33/49]
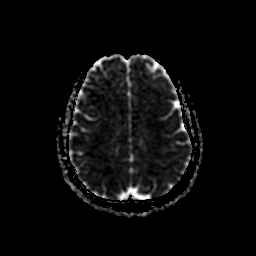
[im 49/49]
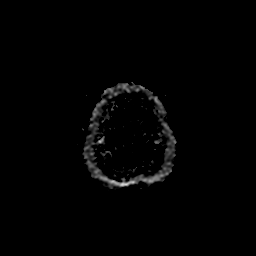

[Series 500: DWI · coronal · 3.0mm · 1.09mm/px · 5 of 61 slices shown (4 of 4)]
[im 1/61]
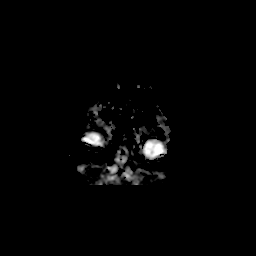
[im 16/61]
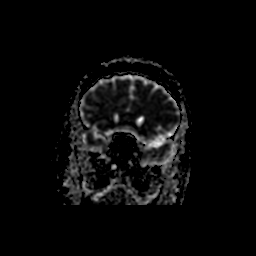
[im 31/61]
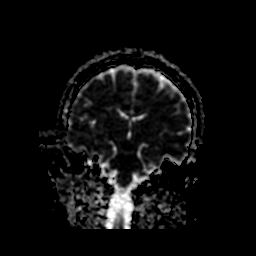
[im 46/61]
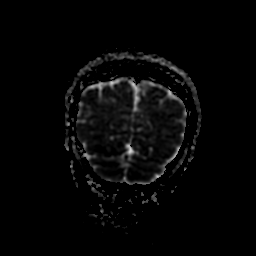
[im 61/61]
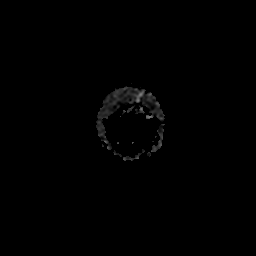

[37 of 48 positions shown; findings below may reference images not displayed]

FINDINGS: BRAIN: There is no acute infarct, acute hemorrhage, hydrocephalus or
extra-axial collection. Incidentally noted and unchanged partially
empty sella. There are no old infarcts. The white matter signal is
normal for the patient's age. The cerebral and cerebellar volume are
age-appropriate. Susceptibility-sensitive sequences show no chronic
microhemorrhage or superficial siderosis.

VASCULAR: Major intracranial arterial and venous sinus flow voids
are normal.

SKULL AND UPPER CERVICAL SPINE: Calvarial bone marrow signal is
normal. There is no skull base mass. Visualized upper cervical spine
and soft tissues are normal.

SINUSES/ORBITS: No fluid levels or advanced mucosal thickening. No
mastoid or middle ear effusion. The orbits are normal.
IMPRESSION: Normal MRI of the brain.

## 2019-11-12 ENCOUNTER — Encounter: Payer: Self-pay | Admitting: Internal Medicine

## 2019-11-12 ENCOUNTER — Ambulatory Visit (INDEPENDENT_AMBULATORY_CARE_PROVIDER_SITE_OTHER): Payer: No Typology Code available for payment source | Admitting: Internal Medicine

## 2019-11-12 ENCOUNTER — Other Ambulatory Visit: Payer: Self-pay

## 2019-11-12 VITALS — BP 118/78 | HR 98 | Temp 98.3°F | Ht 60.0 in | Wt 175.2 lb

## 2019-11-12 DIAGNOSIS — E0781 Sick-euthyroid syndrome: Secondary | ICD-10-CM | POA: Diagnosis not present

## 2019-11-12 DIAGNOSIS — R7989 Other specified abnormal findings of blood chemistry: Secondary | ICD-10-CM | POA: Diagnosis not present

## 2019-11-12 DIAGNOSIS — E236 Other disorders of pituitary gland: Secondary | ICD-10-CM | POA: Insufficient documentation

## 2019-11-12 LAB — TSH: TSH: 0.58 u[IU]/mL (ref 0.35–4.50)

## 2019-11-12 LAB — T4, FREE: Free T4: 0.71 ng/dL (ref 0.60–1.60)

## 2019-11-12 NOTE — Progress Notes (Signed)
Name: Caitlyn Wilson  MRN/ DOB: [id number], 1966/06/26    Age/ Sex: 54 y.o., female    PCP: Rudene Anda, MD   Reason for Endocrinology Evaluation: Abnormal TSH      Date of Initial Endocrinology Evaluation: 11/12/2019     HPI: Caitlyn Wilson is a 54 y.o. female with a past medical history of HTN, dyslipidemia and migraine headaches . The patient presented for initial endocrinology clinic visit on 11/12/2019 for consultative assistance with her low TSH .   Was found to have a low TSH during evaluation of fever and diarrhea. This was after 2 influenza positive tests   She denies weight loss, diarrhea has resolved  Denies any local neck swelling.  Has occasional palpitations   No FH of thyroid disease.        HISTORY:  Past Medical History:  Past Medical History:  Diagnosis Date  . Empty sella syndrome ([place])   . GERD (gastroesophageal reflux disease)   . Headache(784.0)    migraines many yrs ago  . Heart murmur   . Hypercholesteremia   . Hypertension   . Vertigo    Past Surgical History:  Past Surgical History:  Procedure Laterality Date  . BREAST BIOPSY Left 10/07/2006   benign  . COLONOSCOPY WITH PROPOFOL N/A 01/11/2014   Procedure: COLONOSCOPY WITH PROPOFOL;  Surgeon: Garlan Fair, MD;  Location: [facility];  Service: Endoscopy;  Laterality: N/A;  . TONSILLECTOMY        Social History:  reports that she has never smoked. She has never used smokeless tobacco. She reports current alcohol use. She reports that she does not use drugs.  Family History: family history is not on file.   HOME MEDICATIONS: Allergies as of 11/12/2019      Reactions   Amoxicillin Rash   Has patient had a PCN reaction causing immediate rash, facial/tongue/throat swelling, SOB or lightheadedness with hypotension: no Has patient had a PCN reaction causing severe rash involving mucus membranes or skin necrosis: No Has patient had a PCN reaction that required hospitalization:  No Has patient had a PCN reaction occurring within the last 10 years: Yes If all of the above answers are "NO", then may proceed with Cephalosporin use.   Penicillins Rash   Allergic to all cillins Has patient had a PCN reaction causing immediate rash, facial/tongue/throat swelling, SOB or lightheadedness with hypotension: no Has patient had a PCN reaction causing severe rash involving mucus membranes or skin necrosis: no Has patient had a PCN reaction that required hospitalization: no Has patient had a PCN reaction occurring within the last 10 years: yes If all of the above answers are "NO", then may proceed with Cephalosporin use.      Medication List       Accurate as of November 12, 2019  1:11 PM. If you have any questions, ask your nurse or doctor.        ALPRAZolam 0.5 MG tablet Commonly known as: XANAX Take 0.25-0.5 mg by mouth daily as needed for anxiety.   amLODipine 5 MG tablet Commonly known as: NORVASC Take 0.5 tablets (2.5 mg total) by mouth daily.   aspirin EC 81 MG tablet Take 1 tablet (81 mg total) by mouth daily.   hydrochlorothiazide 25 MG tablet Commonly known as: HYDRODIURIL Take 12.5 mg by mouth at bedtime.   zolpidem 5 MG tablet Commonly known as: AMBIEN Take 2.5 mg by mouth at bedtime as needed for sleep.  REVIEW OF SYSTEMS: A comprehensive ROS was conducted with the patient and is negative except as per HPI and below:  ROS     OBJECTIVE:  VS: BP 118/78 (BP Location: Left Arm, Patient Position: Sitting, Cuff Size: Large)   Pulse 98   Temp 98.3 F (36.8 C)   Ht 5' (1.524 m)   Wt 175 lb 3.2 oz (79.5 kg)   SpO2 98%   BMI 34.22 kg/m    Wt Readings from Last 3 Encounters:  11/12/19 175 lb 3.2 oz (79.5 kg)  01/06/19 169 lb 12.1 oz (77 kg)  12/05/18 170 lb (77.1 kg)     EXAM: General: Pt appears well and is in NAD  Eyes: External eye exam normal without stare, lid lag or exophthalmos.  EOM intact.    Neck: General: Supple without  adenopathy. Thyroid: Thyroid size normal.  No goiter or nodules appreciated. No thyroid bruit.  Lungs: Clear with good BS bilat with no rales, rhonchi, or wheezes  Heart: Auscultation: RRR.  Abdomen: Normoactive bowel sounds, soft, nontender, without masses or organomegaly palpable  Extremities: Gait and station: Normal gait  Digits and nails: No clubbing, cyanosis, petechiae, or nodes  BL LE: No pretibial edema normal ROM and strength.  Skin: Hair: Texture and amount normal with gender appropriate distribution Skin Inspection: No rashes Skin Palpation: Skin temperature, texture, and thickness normal to palpation  Neuro: Cranial nerves: II - XII grossly intact  Motor: Normal strength throughout DTRs: 2+ and symmetric in UE without delay in relaxation phase  Mental Status: Judgment, insight: Intact Orientation: Oriented to time, place, and person Mood and affect: No depression, anxiety, or agitation     DATA REVIEWED:  Results for MARLISE, FAHR (MRN [id number]) as of 11/14/2019 18:13  Ref. Range 11/12/2019 13:33  TSH Latest Ref Range: 0.35 - 4.50 uIU/mL 0.58  Triiodothyronine (T3) Latest Ref Range: 76 - 181 ng/dL 638  V5,IEPP(IRJJOA) Latest Ref Range: 0.60 - 1.60 ng/dL 4.16    02/13/3015  TSH 0.537    CT Head 12/06/2018 Brain: Partially empty sella as seen on prior MRIs. No intracranial hemorrhage, mass effect, or midline shift. No hydrocephalus. The basilar cisterns are patent. No evidence of territorial infarct or acute ischemia. No extra-axial or intracranial fluid collection.    ASSESSMENT/PLAN/RECOMMENDATIONS:   1.Subclinical Hyperthyroidism:  - Clinically euthyroid - No local neck symptoms.  - Repeat TFT's are normal today, I suspect she had euthyroid sick syndrome  Which has resolved since.   2. Partial Empty Sella [place]:   - This was noted during review of her records.  - No prior endocrine work up.  - No clinical evidence of hypopituitarism. Will proceed with  labs work.    F/U PRN   Signed electronically by: Lyndle Herrlich, MD  [facility]  [facility] [address] [place] [place], [place] [address] Phone: [phone] FAX: [phone]   CC: Truett Perna, MD [address] [place] [address] Phone: [phone] Fax: [phone]   Return to Endocrinology clinic as below: No future appointments.

## 2019-11-12 NOTE — Patient Instructions (Signed)
-   Please stop by the lab today and on Monday  

## 2019-11-13 LAB — T3: T3, Total: 134 ng/dL (ref 76–181)

## 2019-11-14 DIAGNOSIS — E0781 Sick-euthyroid syndrome: Secondary | ICD-10-CM | POA: Insufficient documentation

## 2019-11-15 ENCOUNTER — Other Ambulatory Visit (INDEPENDENT_AMBULATORY_CARE_PROVIDER_SITE_OTHER): Payer: No Typology Code available for payment source

## 2019-11-15 ENCOUNTER — Other Ambulatory Visit: Payer: Self-pay | Admitting: Internal Medicine

## 2019-11-15 ENCOUNTER — Other Ambulatory Visit: Payer: Self-pay

## 2019-11-15 DIAGNOSIS — E236 Other disorders of pituitary gland: Secondary | ICD-10-CM | POA: Diagnosis not present

## 2019-11-15 LAB — LUTEINIZING HORMONE: LH: 67.35 m[IU]/mL

## 2019-11-15 LAB — CORTISOL: Cortisol, Plasma: 20.4 ug/dL

## 2019-11-15 LAB — FOLLICLE STIMULATING HORMONE: FSH: 174 m[IU]/mL

## 2019-11-16 ENCOUNTER — Encounter: Payer: Self-pay | Admitting: Internal Medicine

## 2019-11-19 LAB — ACTH: C206 ACTH: 22 pg/mL (ref 6–50)

## 2019-11-19 LAB — PROLACTIN: Prolactin: 9.5 ng/mL

## 2019-11-19 LAB — INSULIN-LIKE GROWTH FACTOR
IGF-I, LC/MS: 148 ng/mL (ref 50–317)
Z-Score (Female): 0.2 SD (ref ?–2.0)

## 2019-12-02 ENCOUNTER — Ambulatory Visit: Payer: Self-pay

## 2021-01-24 ENCOUNTER — Telehealth: Payer: Self-pay | Admitting: Internal Medicine

## 2021-01-24 NOTE — Telephone Encounter (Signed)
I return Pt call, LVM inform her that he is not our Pt, and according to our rules her at [facility] she need to be an establish Pt with on of the provider before she can apply for the financial assistant

## 2021-01-24 NOTE — Telephone Encounter (Signed)
Copied from CRM [id number]. Topic: General - Other >> Jan 23, 2021  4:50 PM Pawlus, Maxine Glenn A wrote: Reason for CRM: Pt stated she has some questions about the financial process, requested a call back from [facility]. >> Jan 23, 2021  4:51 PM Pawlus, Maxine Glenn A wrote: Also stated her son Seward Carol has been approved for financial aid but still had some follow up questions.    This person is not a patient here but is interested in financial process. Her son applied and was approved and she has remaining questions about his eligibility. Please contact to advise.

## 2023-01-23 ENCOUNTER — Encounter: Payer: Self-pay | Admitting: *Deleted

## 2023-01-23 NOTE — Progress Notes (Signed)
Pt attended screening event on 01/04/23 at [facility], where Pt BP result was 138/94. Pt confirmed Dr. Truett Perna, as her PCP, at [facility] and no SDOH indicated at the event. Per chart review, Pt last visit with PCP was on 11/25/22. Pt has upcoming appointments on 09/08/23 with [facility]. Pt was contacted by phone for BP result follow up. During the call, Pt informed caller that she is receiving care with PCP and taking medication for BP. Pt also informed caller that she appreciate the follow up call. No additional health equity support indicated at this time. Lorri Frederick, CCG

## 2024-10-07 ENCOUNTER — Other Ambulatory Visit: Payer: Self-pay

## 2024-10-07 DIAGNOSIS — N6310 Unspecified lump in the right breast, unspecified quadrant: Secondary | ICD-10-CM
